# Patient Record
Sex: Male | Born: 1945 | Race: White | Hispanic: No | Marital: Married | Smoking: Former smoker
Health system: Southern US, Community
[De-identification: ages and names within clinical notes are randomized; demographics above are authoritative.]

## PROBLEM LIST (undated history)

## (undated) DIAGNOSIS — C4490 Unspecified malignant neoplasm of skin, unspecified: Secondary | ICD-10-CM

## (undated) DIAGNOSIS — K449 Diaphragmatic hernia without obstruction or gangrene: Secondary | ICD-10-CM

## (undated) DIAGNOSIS — E785 Hyperlipidemia, unspecified: Secondary | ICD-10-CM

## (undated) DIAGNOSIS — K219 Gastro-esophageal reflux disease without esophagitis: Secondary | ICD-10-CM

## (undated) DIAGNOSIS — K644 Residual hemorrhoidal skin tags: Secondary | ICD-10-CM

## (undated) DIAGNOSIS — K5730 Diverticulosis of large intestine without perforation or abscess without bleeding: Secondary | ICD-10-CM

## (undated) DIAGNOSIS — C3490 Malignant neoplasm of unspecified part of unspecified bronchus or lung: Secondary | ICD-10-CM

## (undated) DIAGNOSIS — Z86010 Personal history of colonic polyps: Secondary | ICD-10-CM

## (undated) DIAGNOSIS — K529 Noninfective gastroenteritis and colitis, unspecified: Secondary | ICD-10-CM

## (undated) HISTORY — DX: Hyperlipidemia, unspecified: E78.5

## (undated) HISTORY — DX: Unspecified malignant neoplasm of skin, unspecified: C44.90

## (undated) HISTORY — DX: Noninfective gastroenteritis and colitis, unspecified: K52.9

## (undated) HISTORY — DX: Hypercalcemia: E83.52

## (undated) HISTORY — DX: Diverticulosis of large intestine without perforation or abscess without bleeding: K57.30

## (undated) HISTORY — PX: INGUINAL HERNIA REPAIR: SHX194

## (undated) HISTORY — DX: Gastro-esophageal reflux disease without esophagitis: K21.9

## (undated) HISTORY — DX: Residual hemorrhoidal skin tags: K64.4

## (undated) HISTORY — DX: Diaphragmatic hernia without obstruction or gangrene: K44.9

## (undated) HISTORY — DX: Personal history of colonic polyps: Z86.010

## (undated) HISTORY — PX: VASECTOMY: SHX75

---

## 1999-02-26 ENCOUNTER — Encounter: Payer: Self-pay | Admitting: *Deleted

## 1999-02-26 ENCOUNTER — Ambulatory Visit (HOSPITAL_COMMUNITY): Admission: RE | Admit: 1999-02-26 | Discharge: 1999-02-26 | Payer: Self-pay | Admitting: *Deleted

## 2002-05-13 HISTORY — PX: RECTAL SURGERY: SHX760

## 2002-07-05 ENCOUNTER — Encounter: Payer: Self-pay | Admitting: Internal Medicine

## 2002-07-05 ENCOUNTER — Encounter: Admission: RE | Admit: 2002-07-05 | Discharge: 2002-07-05 | Payer: Self-pay | Admitting: Internal Medicine

## 2003-01-12 ENCOUNTER — Encounter (INDEPENDENT_AMBULATORY_CARE_PROVIDER_SITE_OTHER): Payer: Self-pay | Admitting: Specialist

## 2003-01-12 ENCOUNTER — Ambulatory Visit (HOSPITAL_COMMUNITY): Admission: RE | Admit: 2003-01-12 | Discharge: 2003-01-12 | Payer: Self-pay | Admitting: General Surgery

## 2003-07-01 ENCOUNTER — Encounter: Payer: Self-pay | Admitting: Internal Medicine

## 2004-06-26 ENCOUNTER — Ambulatory Visit: Payer: Self-pay | Admitting: Internal Medicine

## 2004-12-26 ENCOUNTER — Ambulatory Visit: Payer: Self-pay | Admitting: Internal Medicine

## 2005-03-12 ENCOUNTER — Ambulatory Visit: Payer: Self-pay | Admitting: Internal Medicine

## 2005-03-13 ENCOUNTER — Ambulatory Visit: Payer: Self-pay | Admitting: Internal Medicine

## 2005-04-02 ENCOUNTER — Ambulatory Visit: Payer: Self-pay | Admitting: Internal Medicine

## 2005-05-24 ENCOUNTER — Ambulatory Visit: Payer: Self-pay | Admitting: Internal Medicine

## 2005-12-03 ENCOUNTER — Ambulatory Visit: Payer: Self-pay | Admitting: Internal Medicine

## 2006-01-17 ENCOUNTER — Ambulatory Visit: Payer: Self-pay | Admitting: Internal Medicine

## 2006-01-21 ENCOUNTER — Encounter: Admission: RE | Admit: 2006-01-21 | Discharge: 2006-01-21 | Payer: Self-pay | Admitting: Internal Medicine

## 2006-01-22 ENCOUNTER — Ambulatory Visit: Payer: Self-pay | Admitting: Pulmonary Disease

## 2006-01-22 ENCOUNTER — Ambulatory Visit: Payer: Self-pay | Admitting: Cardiovascular Disease

## 2006-01-22 ENCOUNTER — Ambulatory Visit: Payer: Self-pay | Admitting: Internal Medicine

## 2006-01-27 ENCOUNTER — Encounter: Payer: Self-pay | Admitting: Pulmonary Disease

## 2006-02-19 ENCOUNTER — Encounter (INDEPENDENT_AMBULATORY_CARE_PROVIDER_SITE_OTHER): Payer: Self-pay | Admitting: *Deleted

## 2006-02-19 ENCOUNTER — Ambulatory Visit: Payer: Self-pay | Admitting: Pulmonary Disease

## 2006-02-19 ENCOUNTER — Ambulatory Visit: Admission: RE | Admit: 2006-02-19 | Discharge: 2006-02-19 | Payer: Self-pay | Admitting: Pulmonary Disease

## 2006-03-04 ENCOUNTER — Ambulatory Visit: Payer: Self-pay | Admitting: Pulmonary Disease

## 2006-04-25 ENCOUNTER — Ambulatory Visit: Payer: Self-pay | Admitting: Pulmonary Disease

## 2006-05-13 DIAGNOSIS — Z860100 Personal history of colon polyps, unspecified: Secondary | ICD-10-CM

## 2006-05-13 DIAGNOSIS — K644 Residual hemorrhoidal skin tags: Secondary | ICD-10-CM

## 2006-05-13 DIAGNOSIS — K449 Diaphragmatic hernia without obstruction or gangrene: Secondary | ICD-10-CM

## 2006-05-13 DIAGNOSIS — K5730 Diverticulosis of large intestine without perforation or abscess without bleeding: Secondary | ICD-10-CM

## 2006-05-13 HISTORY — DX: Diaphragmatic hernia without obstruction or gangrene: K44.9

## 2006-05-13 HISTORY — DX: Diverticulosis of large intestine without perforation or abscess without bleeding: K57.30

## 2006-05-13 HISTORY — DX: Personal history of colonic polyps: Z86.010

## 2006-05-13 HISTORY — DX: Personal history of colon polyps, unspecified: Z86.0100

## 2006-05-13 HISTORY — DX: Residual hemorrhoidal skin tags: K64.4

## 2006-05-16 ENCOUNTER — Ambulatory Visit: Payer: Self-pay | Admitting: Pulmonary Disease

## 2006-06-12 ENCOUNTER — Ambulatory Visit: Payer: Self-pay | Admitting: Pulmonary Disease

## 2006-07-11 ENCOUNTER — Ambulatory Visit: Payer: Self-pay | Admitting: Pulmonary Disease

## 2006-07-28 ENCOUNTER — Ambulatory Visit (HOSPITAL_COMMUNITY): Admission: RE | Admit: 2006-07-28 | Discharge: 2006-07-28 | Payer: Self-pay | Admitting: Surgery

## 2006-09-18 ENCOUNTER — Ambulatory Visit: Payer: Self-pay | Admitting: Pulmonary Disease

## 2006-10-28 ENCOUNTER — Telehealth (INDEPENDENT_AMBULATORY_CARE_PROVIDER_SITE_OTHER): Payer: Self-pay | Admitting: *Deleted

## 2006-10-30 DIAGNOSIS — J45909 Unspecified asthma, uncomplicated: Secondary | ICD-10-CM

## 2006-10-30 DIAGNOSIS — Z860100 Personal history of colon polyps, unspecified: Secondary | ICD-10-CM | POA: Insufficient documentation

## 2007-01-28 ENCOUNTER — Telehealth (INDEPENDENT_AMBULATORY_CARE_PROVIDER_SITE_OTHER): Payer: Self-pay | Admitting: *Deleted

## 2007-02-04 DIAGNOSIS — J82 Pulmonary eosinophilia, not elsewhere classified: Secondary | ICD-10-CM

## 2007-02-04 DIAGNOSIS — J8289 Other pulmonary eosinophilia, not elsewhere classified: Secondary | ICD-10-CM | POA: Insufficient documentation

## 2007-02-05 ENCOUNTER — Ambulatory Visit: Payer: Self-pay | Admitting: Pulmonary Disease

## 2007-02-05 ENCOUNTER — Ambulatory Visit: Payer: Self-pay | Admitting: Internal Medicine

## 2007-02-08 LAB — CONVERTED CEMR LAB
ALT: 25 U/L
AST: 37 U/L
Albumin: 3.9 g/dL
Alkaline Phosphatase: 67 U/L
BUN: 9 mg/dL
Basophils Absolute: 0.1 K/uL
Basophils Relative: 1.6 % — ABNORMAL HIGH
Bilirubin, Direct: 0.1 mg/dL
CO2: 27 meq/L
Calcium: 9.4 mg/dL
Chloride: 106 meq/L
Cholesterol: 203 mg/dL
Creatinine, Ser: 0.8 mg/dL
Direct LDL: 138.3 mg/dL
Eosinophils Absolute: 0.4 K/uL
Eosinophils Relative: 9.8 % — ABNORMAL HIGH
GFR calc Af Amer: 127 mL/min
GFR calc non Af Amer: 105 mL/min
Glucose, Bld: 85 mg/dL
HCT: 39.8 %
HDL: 59.7 mg/dL
Hemoglobin: 14.1 g/dL
Lymphocytes Relative: 45.3 %
MCHC: 35.4 g/dL
MCV: 95.3 fL
Monocytes Absolute: 0.3 K/uL
Monocytes Relative: 6.9 %
Neutro Abs: 1.5 K/uL
Neutrophils Relative %: 36.4 % — ABNORMAL LOW
PSA: 1.78 ng/mL
Platelets: 306 K/uL
Potassium: 4.4 meq/L
RBC: 4.18 M/uL — ABNORMAL LOW
RDW: 12.3 %
Sodium: 141 meq/L
TSH: 1.73 u[IU]/mL
Total Bilirubin: 1.2 mg/dL
Total CHOL/HDL Ratio: 3.4
Total Protein: 6.7 g/dL
Triglycerides: 48 mg/dL
VLDL: 10 mg/dL
WBC: 4.1 10*3/microliter — ABNORMAL LOW

## 2007-02-09 ENCOUNTER — Encounter (INDEPENDENT_AMBULATORY_CARE_PROVIDER_SITE_OTHER): Payer: Self-pay | Admitting: *Deleted

## 2007-02-17 ENCOUNTER — Ambulatory Visit: Payer: Self-pay | Admitting: Internal Medicine

## 2007-02-17 DIAGNOSIS — K219 Gastro-esophageal reflux disease without esophagitis: Secondary | ICD-10-CM | POA: Insufficient documentation

## 2007-02-17 DIAGNOSIS — E785 Hyperlipidemia, unspecified: Secondary | ICD-10-CM | POA: Insufficient documentation

## 2007-02-26 ENCOUNTER — Ambulatory Visit: Payer: Self-pay | Admitting: Gastroenterology

## 2007-03-25 ENCOUNTER — Encounter: Payer: Self-pay | Admitting: Internal Medicine

## 2007-03-25 ENCOUNTER — Encounter: Payer: Self-pay | Admitting: Gastroenterology

## 2007-03-25 ENCOUNTER — Ambulatory Visit: Payer: Self-pay | Admitting: Gastroenterology

## 2007-05-04 ENCOUNTER — Telehealth (INDEPENDENT_AMBULATORY_CARE_PROVIDER_SITE_OTHER): Payer: Self-pay | Admitting: *Deleted

## 2007-05-05 ENCOUNTER — Ambulatory Visit: Payer: Self-pay | Admitting: Pulmonary Disease

## 2007-07-15 DIAGNOSIS — K5730 Diverticulosis of large intestine without perforation or abscess without bleeding: Secondary | ICD-10-CM | POA: Insufficient documentation

## 2007-09-21 ENCOUNTER — Telehealth (INDEPENDENT_AMBULATORY_CARE_PROVIDER_SITE_OTHER): Payer: Self-pay | Admitting: *Deleted

## 2008-01-27 ENCOUNTER — Encounter (INDEPENDENT_AMBULATORY_CARE_PROVIDER_SITE_OTHER): Payer: Self-pay | Admitting: *Deleted

## 2008-02-10 ENCOUNTER — Telehealth (INDEPENDENT_AMBULATORY_CARE_PROVIDER_SITE_OTHER): Payer: Self-pay | Admitting: *Deleted

## 2008-02-24 ENCOUNTER — Encounter: Payer: Self-pay | Admitting: Internal Medicine

## 2008-03-08 ENCOUNTER — Telehealth (INDEPENDENT_AMBULATORY_CARE_PROVIDER_SITE_OTHER): Payer: Self-pay | Admitting: *Deleted

## 2008-03-10 ENCOUNTER — Telehealth (INDEPENDENT_AMBULATORY_CARE_PROVIDER_SITE_OTHER): Payer: Self-pay | Admitting: *Deleted

## 2008-03-15 ENCOUNTER — Ambulatory Visit: Payer: Self-pay | Admitting: Pulmonary Disease

## 2008-03-22 ENCOUNTER — Telehealth (INDEPENDENT_AMBULATORY_CARE_PROVIDER_SITE_OTHER): Payer: Self-pay | Admitting: *Deleted

## 2008-03-23 ENCOUNTER — Ambulatory Visit: Payer: Self-pay | Admitting: Pulmonary Disease

## 2008-06-21 ENCOUNTER — Ambulatory Visit: Payer: Self-pay | Admitting: Pulmonary Disease

## 2008-06-21 DIAGNOSIS — J309 Allergic rhinitis, unspecified: Secondary | ICD-10-CM | POA: Insufficient documentation

## 2008-06-27 ENCOUNTER — Telehealth (INDEPENDENT_AMBULATORY_CARE_PROVIDER_SITE_OTHER): Payer: Self-pay | Admitting: *Deleted

## 2008-07-01 ENCOUNTER — Telehealth: Payer: Self-pay | Admitting: Pulmonary Disease

## 2008-07-04 ENCOUNTER — Telehealth (INDEPENDENT_AMBULATORY_CARE_PROVIDER_SITE_OTHER): Payer: Self-pay | Admitting: *Deleted

## 2008-07-13 ENCOUNTER — Ambulatory Visit: Payer: Self-pay | Admitting: Internal Medicine

## 2008-07-13 ENCOUNTER — Encounter: Payer: Self-pay | Admitting: Pulmonary Disease

## 2008-07-15 ENCOUNTER — Telehealth: Payer: Self-pay | Admitting: Pulmonary Disease

## 2008-07-15 ENCOUNTER — Telehealth (INDEPENDENT_AMBULATORY_CARE_PROVIDER_SITE_OTHER): Payer: Self-pay | Admitting: *Deleted

## 2008-07-15 LAB — CONVERTED CEMR LAB
Basophils Absolute: 0.1 10*3/uL (ref 0.0–0.1)
Basophils Relative: 2.3 % (ref 0.0–3.0)
Eosinophils Absolute: 0.3 10*3/uL (ref 0.0–0.7)
HCT: 41.6 % (ref 39.0–52.0)
Hemoglobin: 14.6 g/dL (ref 13.0–17.0)
IgE (Immunoglobulin E), Serum: 96.5 intl units/mL (ref 0.0–180.0)
MCHC: 35.1 g/dL (ref 30.0–36.0)
MCV: 93.1 fL (ref 78.0–100.0)
Monocytes Absolute: 0.4 10*3/uL (ref 0.1–1.0)
Neutro Abs: 2.6 10*3/uL (ref 1.4–7.7)
RBC: 4.47 M/uL (ref 4.22–5.81)

## 2008-07-25 ENCOUNTER — Telehealth (INDEPENDENT_AMBULATORY_CARE_PROVIDER_SITE_OTHER): Payer: Self-pay | Admitting: *Deleted

## 2008-08-01 ENCOUNTER — Encounter (INDEPENDENT_AMBULATORY_CARE_PROVIDER_SITE_OTHER): Payer: Self-pay | Admitting: *Deleted

## 2008-08-01 ENCOUNTER — Telehealth: Payer: Self-pay | Admitting: Internal Medicine

## 2008-09-01 ENCOUNTER — Telehealth (INDEPENDENT_AMBULATORY_CARE_PROVIDER_SITE_OTHER): Payer: Self-pay | Admitting: *Deleted

## 2008-09-13 ENCOUNTER — Ambulatory Visit: Payer: Self-pay | Admitting: Internal Medicine

## 2008-09-13 DIAGNOSIS — R030 Elevated blood-pressure reading, without diagnosis of hypertension: Secondary | ICD-10-CM | POA: Insufficient documentation

## 2008-09-13 DIAGNOSIS — Z85828 Personal history of other malignant neoplasm of skin: Secondary | ICD-10-CM | POA: Insufficient documentation

## 2008-09-14 ENCOUNTER — Encounter: Payer: Self-pay | Admitting: Internal Medicine

## 2008-09-14 ENCOUNTER — Ambulatory Visit: Payer: Self-pay | Admitting: Pulmonary Disease

## 2008-09-15 LAB — CONVERTED CEMR LAB
Hemoglobin: 14.4 g/dL (ref 13.0–17.0)
Lymphocytes Relative: 27 % (ref 12–46)
Lymphs Abs: 1.8 10*3/uL (ref 0.7–4.0)
MCHC: 33.3 g/dL (ref 30.0–36.0)
Monocytes Absolute: 0.5 10*3/uL (ref 0.1–1.0)
Monocytes Relative: 7 % (ref 3–12)
Neutro Abs: 4.1 10*3/uL (ref 1.7–7.7)
RBC: 4.58 M/uL (ref 4.22–5.81)
WBC: 6.8 10*3/uL (ref 4.0–10.5)

## 2008-09-16 ENCOUNTER — Encounter (INDEPENDENT_AMBULATORY_CARE_PROVIDER_SITE_OTHER): Payer: Self-pay | Admitting: *Deleted

## 2008-09-27 ENCOUNTER — Encounter (INDEPENDENT_AMBULATORY_CARE_PROVIDER_SITE_OTHER): Payer: Self-pay | Admitting: *Deleted

## 2009-02-08 ENCOUNTER — Ambulatory Visit: Payer: Self-pay | Admitting: Pulmonary Disease

## 2009-02-13 ENCOUNTER — Encounter: Payer: Self-pay | Admitting: Internal Medicine

## 2009-03-15 ENCOUNTER — Telehealth: Payer: Self-pay | Admitting: Gastroenterology

## 2009-03-20 ENCOUNTER — Ambulatory Visit: Payer: Self-pay | Admitting: Pulmonary Disease

## 2009-04-26 ENCOUNTER — Telehealth (INDEPENDENT_AMBULATORY_CARE_PROVIDER_SITE_OTHER): Payer: Self-pay | Admitting: *Deleted

## 2009-05-09 ENCOUNTER — Telehealth: Payer: Self-pay | Admitting: Pulmonary Disease

## 2009-06-08 ENCOUNTER — Ambulatory Visit (HOSPITAL_COMMUNITY): Admission: RE | Admit: 2009-06-08 | Discharge: 2009-06-08 | Payer: Self-pay | Admitting: Surgery

## 2009-06-16 ENCOUNTER — Telehealth: Payer: Self-pay | Admitting: Pulmonary Disease

## 2009-09-04 ENCOUNTER — Telehealth (INDEPENDENT_AMBULATORY_CARE_PROVIDER_SITE_OTHER): Payer: Self-pay | Admitting: *Deleted

## 2009-09-06 ENCOUNTER — Telehealth (INDEPENDENT_AMBULATORY_CARE_PROVIDER_SITE_OTHER): Payer: Self-pay | Admitting: *Deleted

## 2009-09-13 ENCOUNTER — Telehealth: Payer: Self-pay | Admitting: Pulmonary Disease

## 2009-10-03 ENCOUNTER — Telehealth: Payer: Self-pay | Admitting: Pulmonary Disease

## 2009-10-03 ENCOUNTER — Encounter: Payer: Self-pay | Admitting: Pulmonary Disease

## 2009-10-10 ENCOUNTER — Ambulatory Visit: Payer: Self-pay | Admitting: Pulmonary Disease

## 2009-11-07 ENCOUNTER — Telehealth (INDEPENDENT_AMBULATORY_CARE_PROVIDER_SITE_OTHER): Payer: Self-pay | Admitting: *Deleted

## 2009-12-14 ENCOUNTER — Telehealth (INDEPENDENT_AMBULATORY_CARE_PROVIDER_SITE_OTHER): Payer: Self-pay | Admitting: *Deleted

## 2010-01-16 ENCOUNTER — Encounter (INDEPENDENT_AMBULATORY_CARE_PROVIDER_SITE_OTHER): Payer: Self-pay | Admitting: *Deleted

## 2010-01-16 ENCOUNTER — Telehealth (INDEPENDENT_AMBULATORY_CARE_PROVIDER_SITE_OTHER): Payer: Self-pay | Admitting: *Deleted

## 2010-01-22 ENCOUNTER — Encounter: Payer: Self-pay | Admitting: Internal Medicine

## 2010-02-09 ENCOUNTER — Telehealth (INDEPENDENT_AMBULATORY_CARE_PROVIDER_SITE_OTHER): Payer: Self-pay | Admitting: *Deleted

## 2010-02-19 ENCOUNTER — Telehealth (INDEPENDENT_AMBULATORY_CARE_PROVIDER_SITE_OTHER): Payer: Self-pay | Admitting: *Deleted

## 2010-02-21 ENCOUNTER — Ambulatory Visit: Payer: Self-pay | Admitting: Internal Medicine

## 2010-02-27 ENCOUNTER — Telehealth (INDEPENDENT_AMBULATORY_CARE_PROVIDER_SITE_OTHER): Payer: Self-pay | Admitting: *Deleted

## 2010-02-28 LAB — CONVERTED CEMR LAB
Albumin: 4.2 g/dL (ref 3.5–5.2)
Basophils Relative: 1.1 % (ref 0.0–3.0)
CO2: 29 meq/L (ref 19–32)
Calcium: 9.9 mg/dL (ref 8.4–10.5)
Chloride: 103 meq/L (ref 96–112)
Cholesterol: 241 mg/dL — ABNORMAL HIGH (ref 0–200)
Direct LDL: 158.8 mg/dL
Eosinophils Relative: 5.4 % — ABNORMAL HIGH (ref 0.0–5.0)
HCT: 42.2 % (ref 39.0–52.0)
MCV: 97.4 fL (ref 78.0–100.0)
Monocytes Absolute: 0.5 10*3/uL (ref 0.1–1.0)
Monocytes Relative: 9.2 % (ref 3.0–12.0)
Neutrophils Relative %: 35.6 % — ABNORMAL LOW (ref 43.0–77.0)
PSA: 1.86 ng/mL (ref 0.10–4.00)
RBC: 4.33 M/uL (ref 4.22–5.81)
Sodium: 140 meq/L (ref 135–145)
TSH: 4.43 microintl units/mL (ref 0.35–5.50)
Total CHOL/HDL Ratio: 4
Total Protein: 6.9 g/dL (ref 6.0–8.3)
Triglycerides: 92 mg/dL (ref 0.0–149.0)
VLDL: 18.4 mg/dL (ref 0.0–40.0)
WBC: 5.6 10*3/uL (ref 4.5–10.5)

## 2010-03-05 ENCOUNTER — Telehealth (INDEPENDENT_AMBULATORY_CARE_PROVIDER_SITE_OTHER): Payer: Self-pay | Admitting: *Deleted

## 2010-03-07 ENCOUNTER — Ambulatory Visit: Payer: Self-pay | Admitting: Internal Medicine

## 2010-03-07 LAB — CONVERTED CEMR LAB
HDL goal, serum: 40 mg/dL
LDL Goal: 160 mg/dL

## 2010-03-16 ENCOUNTER — Telehealth: Payer: Self-pay | Admitting: Internal Medicine

## 2010-03-16 ENCOUNTER — Telehealth: Payer: Self-pay | Admitting: Gastroenterology

## 2010-03-19 ENCOUNTER — Encounter: Payer: Self-pay | Admitting: Internal Medicine

## 2010-03-20 ENCOUNTER — Encounter: Payer: Self-pay | Admitting: Internal Medicine

## 2010-04-27 ENCOUNTER — Telehealth (INDEPENDENT_AMBULATORY_CARE_PROVIDER_SITE_OTHER): Payer: Self-pay | Admitting: *Deleted

## 2010-05-13 DIAGNOSIS — K529 Noninfective gastroenteritis and colitis, unspecified: Secondary | ICD-10-CM

## 2010-05-13 HISTORY — DX: Noninfective gastroenteritis and colitis, unspecified: K52.9

## 2010-06-10 LAB — CONVERTED CEMR LAB
Alkaline Phosphatase: 56 units/L (ref 39–117)
Bilirubin, Direct: 0.2 mg/dL (ref 0.0–0.3)
GFR calc Af Amer: 126 mL/min
Glucose, Bld: 104 mg/dL — ABNORMAL HIGH (ref 70–99)
Potassium: 4.4 meq/L (ref 3.5–5.1)
Sodium: 140 meq/L (ref 135–145)
Specific Gravity, Urine: 1.01 (ref 1.000–1.035)
TSH: 4.68 microintl units/mL (ref 0.35–5.50)
Total Protein, Urine: NEGATIVE mg/dL
Total Protein: 6.7 g/dL (ref 6.0–8.3)
Urine Glucose: NEGATIVE mg/dL
Urobilinogen, UA: 0.2 (ref 0.0–1.0)
VLDL: 8 mg/dL (ref 0–40)

## 2010-06-12 NOTE — Assessment & Plan Note (Signed)
Summary: med refil/cbs   Vital Signs:  Patient profile:   65 year old male Height:      72 inches (182.88 cm) Weight:      185.13 pounds (84.15 kg) BMI:     25.20 O2 Sat:      99 % on Room air Temp:     98.9 degrees F (37.17 degrees C) oral Pulse rate:   57 / minute Resp:     16 per minute BP sitting:   160 / 120  (left arm) Cuff size:   regular  Vitals Entered By: Lucious Groves CMA (March 07, 2010 12:59 PM)  O2 Flow:  Room air  CC: med refill/cpx./kb, General Medical Evaluation, COPD follow-up, Lipid Management Is Patient Diabetic? No Pain Assessment Patient in pain? no      Comments Patient notes that vitamin d3 is on hold due to studies he heard about./kb   Primary Care Levante Simones:  [name]  CC:  med refill/cpx./kb, General Medical Evaluation, COPD follow-up, and Lipid Management.  History of Present Illness:        [name] is here for a physical; he is essentially asymptomatic. Symbicort has been effective  as a controller.  The patient denies shortness of breath, chest tightness, wheezing, cough, increased sputum, heat intolerance, nocturnal awakening, and exercise induced symptoms.  Symptoms occur very rarely.  The patient reports no limitation in activities.  Medication use includes quick relief med rarely ( last used 08/2009)and  the controller med daily.  Symptoms  have been  worse with pollen in Spring & Fall historically.  This past Spring he was able to control symptoms with rescue inhaler.Last steroids in 2010.  Lipid Management History:      Positive NCEP/ATP III risk factors include male age 27 years old or older and family history for ischemic heart disease (males less than 64 years old).  Negative NCEP/ATP III risk factors include non-diabetic, non-tobacco-user status, non-hypertensive, no ASHD (atherosclerotic heart disease), no prior stroke/TIA, no peripheral vascular disease, and no history of aortic aneurysm.     Current Medications (verified): 1)   Aciphex 20 Mg  Tbec (Rabeprazole Sodium) .Marland Kitchen.. 1 By Mouth Once Daily **appointment Due** 2)  Singulair 10 Mg  Tabs (Montelukast Sodium) .... Take 1 Tablet By Mouth Once A Day 3)  Flonase 50 Mcg/act  Susp (Fluticasone Propionate) .... Two Puffs Each Nostril Daily 4)  Symbicort 160-4.5 Mcg/act  Aero (Budesonide-Formoterol Fumarate) .... Take Two Puffs Two Times A Day 5)  Multivitamins   Tabs (Multiple Vitamin) .... Take One By Mouth Once Daily 6)  Fish Oil   Oil (Fish Oil) 7)  Calcium 500 500 Mg  Tabs (Calcium Carbonate) 8)  Zyrtec Allergy 10 Mg  Tabs (Cetirizine Hcl) .Marland Kitchen.. 1 By Mouth Qd 9)  Maxair Autohaler 200 Mcg/inh  Aerb (Pirbuterol Acetate) .... Use As Directed 10)  Analpram-Hc 1-2.5 % Crea (Hydrocortisone Ace-Pramoxine) .... Use As Directed As Needed 11)  Mucinex 600 Mg Xr12h-Tab (Guaifenesin) .Marland Kitchen.. 1-2 Every 12 Hours As Needed 12)  Vitamin C 500 Mg Tabs (Ascorbic Acid) .... Once Daily 13)  Vit D3 .... On Hold 14)  Aspir-Low 81 Mg Tbec (Aspirin) .Marland Kitchen.. 1 By Mouth Qd  Allergies (verified): No Known Drug Allergies  Past History:  Past Medical History: Asthma, extrinsic Colonic polyps, PMH  of, Dr Jarold Motto Hypercalcemia, PMH of  1986 (11.0) Hyperlipidemia, LDL goal = < 110 based on NMR Lipoprofile: LDL 138( 1624/877),HDL 47, TG 64. Framingham Study LDL goal = < 130.  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 primary  Social History: Former Smoker: quit 1992 Alcohol use-yes: socially no diet Occupation: Pensions consultant Married Regular  exercise-yes: 5X/week or >  as running  & aerobics , yoga  Review of Systems  The patient denies anorexia, fever, weight loss, weight gain, vision loss, decreased hearing, hoarseness, chest pain, syncope, peripheral edema, headaches, abdominal pain, melena, hematochezia, severe indigestion/heartburn, hematuria, suspicious skin lesions, depression, unusual weight change, abnormal bleeding, enlarged lymph nodes, and angioedema.    Physical Exam  General:  well-nourished;alert,appropriate and cooperative throughout examination Head:  Normocephalic and atraumatic without obvious abnormalities. No  alopecia Eyes:  No corneal or conjunctival inflammation noted. Perrla. Funduscopic exam benign, without hemorrhages, exudates or papilledema.  Ears:  External ear exam shows no significant lesions or deformities.  Otoscopic examination reveals clear canals, tympanic membranes are intact bilaterally without bulging, retraction, inflammation or discharge. Hearing is grossly normal bilaterally. Nose:  External nasal examination shows no deformity or inflammation. Nasal mucosa are  dry  without lesions or exudates. Mouth:  Oral mucosa and oropharynx without lesions or exudates.  Teeth in good repair. Slightly hoarse Neck:  No deformities, masses, or tenderness noted. Lungs:  Normal respiratory effort, chest expands symmetrically. Lungs : mild low grade rhonchi @ bases w/o increased work of breathing Heart:  Normal rate and regular rhythm. S1 and S2 normal without gallop, murmur, click, rub .S4 Abdomen:  Bowel sounds positive,abdomen soft and non-tender without masses, organomegaly . Small umbilical  hernia  noted. Rectal:  No external abnormalities noted. Normal sphincter tone. No rectal masses or tenderness. Genitalia:  Testes bilaterally descended without nodularity, tenderness or masses. No scrotal masses or lesions. No penis lesions or urethral discharge. Small  L varicocele.   Prostate:  Prostate gland  firm and smooth, no enlargement, nodularity, tenderness, mass, asymmetry or induration. Msk:  No deformity or scoliosis noted of thoracic or lumbar spine.   Pulses:  R and L carotid,radial,dorsalis pedis and posterior tibial pulses are full and equal bilaterally Extremities:  No clubbing, cyanosis, edema, or deformity noted with normal full range of motion of all joints.   Neurologic:  alert & oriented X3 and DTRs symmetrical and normal.   Skin:  Intact without suspicious lesions or rashes Cervical Nodes:  No lymphadenopathy noted Axillary Nodes:  No palpable lymphadenopathy Inguinal Nodes:  No significant adenopathy Psych:  memory intact for recent and remote, normally interactive, and good eye contact.     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 1 by mouth once daily **appointment due** 2)  Singulair 10 Mg Tabs (Montelukast sodium) .... Take 1 tablet by mouth once a day 3)  Flonase 50 Mcg/act Susp (Fluticasone propionate) .... Two puffs each nostril daily 4)  Symbicort  160-4.5 Mcg/act Aero (Budesonide-formoterol fumarate) .... Take two puffs two times a day 5)  Multivitamins Tabs (Multiple vitamin) .... Take one by mouth once daily 6)  Fish Oil Oil (Fish oil) 7)  Calcium 500 500 Mg Tabs (Calcium carbonate) 8)  Zyrtec Allergy 10 Mg Tabs (Cetirizine hcl) .Marland Kitchen.. 1 by mouth qd 9)  Maxair Autohaler 200 Mcg/inh Aerb (Pirbuterol acetate) .... Use as directed 10)  Analpram-hc 1-2.5 % Crea (Hydrocortisone ace-pramoxine) .... Use as directed as needed 11)  Mucinex 600 Mg Xr12h-tab (Guaifenesin) .Marland Kitchen.. 1-2 every 12 hours as needed 12)  Vitamin C 500 Mg Tabs (Ascorbic acid)  .... Once daily 13)  Vit D3  .... On hold 14)  Aspir-low 81 Mg Tbec (Aspirin) .Marland Kitchen.. 1 by mouth qd  Lipid Assessment/Plan:      Based on NCEP/ATP III, the patient's risk factor category is "2 or more risk factors and a calculated 10 year CAD risk of > 20%".  The patient's lipid goals are as follows: Total cholesterol goal is 200; LDL cholesterol goal is 160; HDL cholesterol goal is 40; Triglyceride goal is 150.    Patient Instructions: 1)  Review Dr Gildardo Griffes Eat , Drink & Be Healthy. 2)  It is important that you exercise regularly at least 20 minutes 5 times a week. If you develop chest pain, have severe difficulty breathing, or feel very tired , stop exercising immediately and seek medical attention. 3)  Take an  81 mg coated Aspirin every day. 4)  Please schedule a follow-up appointment in 6 months. 5)  Hepatic Panel prior to visit, ICD-9:790.4 6)  [facility] Lipid Panel prior to visit, ICD-9:272.4, V17.3.   Orders Added: 1)  Est. Patient 40-64 years [99396]   Immunization History:  Influenza Immunization History:    Influenza:  historical (01/11/2010)   Immunization History:  Influenza Immunization History:    Influenza:  Historical (01/11/2010)   Appended Document: med refil/cbs "BP is only elevated @ MD visits". Monitor  BP ; goal = < 135/85 ON AVERAGE . Take cuff to every MD  appt. Check BP cuff  against drug store or Y cuff.

## 2010-06-12 NOTE — Progress Notes (Signed)
Summary: aciphex refill---needs prior auth  Phone Note Refill Request Message from:  Fax from Pharmacy on March 16, 2010 11:50 AM  Refills Requested: Medication #1:  ACIPHEX 20 MG  TBEC 1 by mouth once daily **APPOINTMENT DUE** [facility], fax [phone]    "ins now requires prior auth....as of 03/13/10"-----pharmacy requests notification when approval obtained-----[phone],     customer id = [id number]  Initial call taken by: Jerolyn Shin,  March 16, 2010 11:57 AM  Follow-up for Phone Call        Per request samples placed up front for patient.  Lucious Groves CMA,  March 16, 2010 2:44 PM  Forms requested. Lucious Groves CMA  March 16, 2010 3:56 PM   Forms completed and faxed. Will await insurance company reply. Lucious Groves CMA  March 19, 2010 3:50 PM        Appended Document: aciphex refill---needs prior auth Approved until 2014. Left message on voicemail notifying the patient and faxed approval to [facility] pharmacy.

## 2010-06-12 NOTE — Assessment & Plan Note (Signed)
Summary: rov for asthma, CR   Primary Provider/Referring Provider:  [name]  CC:  Pt is here for a 6 month f/u appt.  Pt denies sob, cough, and acid reflux.  Pt states he hasn't needed to use rescue hfa often.  Pt also denies sore throat and head congestion.  Marland Kitchen  History of Present Illness: the pt comes in today for f/u of his known asthma and h/o pulmonary eosinophilia.  He is doing quite well on his current regimen, and has been exercising regularly.  He has not had nocturnal symptoms, and almost never uses his rescue inhaler.  He has no significant cough or sinus issues.  Medications Prior to Update: 1)  Aciphex 20 Mg  Tbec (Rabeprazole Sodium) .Marland Kitchen.. 1 By Mouth Qd 2)  Singulair 10 Mg  Tabs (Montelukast Sodium) .... Once Daily 3)  Flonase   Susp (Fluticasone Propionate Susp) .... Use 1 Spray in Each Nostril Once Daily 4)  Symbicort 160-4.5 Mcg/act  Aero (Budesonide-Formoterol Fumarate) .... Take Two Puffs Two Times A Day 5)  Multivitamins   Tabs (Multiple Vitamin) .... Take One By Mouth Once Daily 6)  Fish Oil   Oil (Fish Oil) 7)  Calcium 500 500 Mg  Tabs (Calcium Carbonate) 8)  Zyrtec Allergy 10 Mg  Tabs (Cetirizine Hcl) .Marland Kitchen.. 1 By Mouth Qd 9)  Maxair Autohaler 200 Mcg/inh  Aerb (Pirbuterol Acetate) .... Use As Directed 10)  Analpram-Hc 1-2.5 % Crea (Hydrocortisone Ace-Pramoxine) .... Use As Directed As Needed 11)  Mucinex 600 Mg Xr12h-Tab (Guaifenesin) .Marland Kitchen.. 1-2 Every 12 Hours As Needed 12)  Vitamin C 500 Mg Tabs (Ascorbic Acid) .... Once Daily  Allergies (verified): No Known Drug Allergies  Review of Systems  The patient denies shortness of breath with activity, shortness of breath at rest, productive cough, non-productive cough, coughing up blood, chest pain, irregular heartbeats, acid heartburn, indigestion, loss of appetite, weight change, abdominal pain, difficulty swallowing, sore throat, tooth/dental problems, headaches, nasal congestion/difficulty breathing through nose, sneezing,  itching, ear ache, anxiety, depression, hand/feet swelling, joint stiffness or pain, rash, change in color of mucus, and fever.    Vital Signs:  Patient profile:   65 year old male Height:      72 inches Weight:      183 pounds BMI:     24.91 O2 Sat:      98 % on Room air Temp:     98.4 degrees F oral Pulse rate:   60 / minute BP sitting:   120 / 72  (right arm) Cuff size:   regular  Vitals Entered By: Arman Filter LPN (Oct 10, 2009 8:54 AM)  O2 Flow:  Room air CC: Pt is here for a 6 month f/u appt.  Pt denies sob, cough, acid reflux.  Pt states he hasn't needed to use rescue hfa often.  Pt also denies sore throat and head congestion.   Comments Medications reviewed with patient Arman Filter LPN  Oct 10, 2009 8:55 AM    Physical Exam  General:  wd male in nad Nose:  no obvious sinus drainage or purulence. Lungs:  totally clear to auscultation Heart:  rrr, no mrg Extremities:  no edema or cyanosis Neurologic:  alert and oriented, moves all 4.   Impression & Recommendations:  Problem # 1:  ASTHMA (ICD-493.90) the pt is doing very well from a pulmonary standpoint.  He is exercising regularly, has no symptoms of an asthma flare, and is not requiring his rescue inhaler.  I have asked  him to continue on his current regimen, and to followup with me in one year.  Problem # 2:  ALLERGIC RHINITIS, CHRONIC (ICD-477.9)  he has been doing very well on his current meds. No recent flares or bouts of sinusitis.  Other Orders: Pneumococcal Vaccine (24401) Admin 1st Vaccine (02725) Est. Patient Level III (36644)  Patient Instructions: 1)  no change in meds. 2)  followup with me in one year, or sooner if having issues.     Immunizations Administered:  Pneumonia Vaccine:    Vaccine Type: Pneumovax    Site: left deltoid    Mfr: Merck    Dose: 0.5 ml    Route: IM    Given by: Arman Filter LPN    Exp. Date: 12/23/2010    Lot #: [id number]    VIS given: 12/09/95 version given  Oct 10, 2009.

## 2010-06-12 NOTE — Medication Information (Signed)
Summary: Symbicort/[facility]  Symbicort/[facility]   Imported By: Sherian Rein 10/16/2009 11:36:05  _____________________________________________________________________  External Attachment:    Type:   Image     Comment:   External Document

## 2010-06-12 NOTE — Progress Notes (Signed)
Summary: NEED LAB ORDERS--entered 10/5  Phone Note Call from Patient Call back at Work Phone [phone]   Caller: Patient Summary of Call: PATIENT NEEDS A PHYSICAL--LAST ONE WAS 09/13/2008---SAYS HE WANTS TO GO TO [place] TO HAVE LABS NEXT WEEK  PLEASE LET ME KNOW ORDERS THAT DR HOPPER WANTS FOR HIM, THEN I WILL CALL HIM FOR THE LAB APPT AND THEN FOR HIS PHYSICAL Initial call taken by: Jerolyn Shin,  February 09, 2010 5:02 PM  Follow-up for Phone Call        V70.0: lipids, hepatic panel, BMET, TSH, CBC & dif, PSA Follow-up by: Marga Melnick MD,  February 12, 2010 1:18 PM  Additional Follow-up for Phone Call Additional follow up Details #1::        LEFT MESSAGE FOR PATIENT TO CALL  ME BACK TO SET UP LABS AT [place]..Jerolyn Shin  February 13, 2010 4:12 PM  called back; set up lab appt--he will set up physical when he gets copy of his labs Additional Follow-up by: Jerolyn Shin,  February 14, 2010 3:47 PM

## 2010-06-12 NOTE — Letter (Signed)
Summary: Primary Care Appointment Letter  [facility] [place]  [address] [place], [place] [address]   Phone: [phone]  Fax: [phone]    01/16/2010 MRN: [id number]  [facility] [address] [place], [place]  [address]  Dear Mr. [facility],   Your Primary Care Physician Marga Melnick MD has indicated that:    ___X____it is time to schedule an appointment(Last OV 09/2008-17months ago, it is time for a yearly chek-up).    _______you missed your appointment on______ and need to call and          reschedule.    _______you need to have lab work done.    _______you need to schedule an appointment discuss lab or test results.    _______you need to call to reschedule your appointment that is                       scheduled on _________.     Please call our office as soon as possible. Our phone number is [phone]. Please press option 1. Our office is open 8a-5p, Monday through Friday.     Thank you,    [facility]

## 2010-06-12 NOTE — Progress Notes (Signed)
Summary: REFILL REQUEST  Phone Note Refill Request Call back at [phone] Message from:  Pharmacy on December 14, 2009 12:02 PM  Refills Requested: Medication #1:  SINGULAIR 10 MG  TABS once daily **APPOINTMENT DUE FOR ADDITIONAL REFILLS**   Dosage confirmed as above?Dosage Confirmed   Supply Requested: 1 month   Last Refilled: 11/07/2009 [facility]  Initial call taken by: Lavell Islam,  December 14, 2009 12:02 PM    Prescriptions: SINGULAIR 10 MG  TABS (MONTELUKAST SODIUM) once daily **APPOINTMENT DUE FOR ADDITIONAL REFILLS**  #30 x 0   Entered by:   Shonna Chock CMA   Authorized by:   Marga Melnick MD   Signed by:   Shonna Chock CMA on 12/14/2009   Method used:   Electronically to        [facility] (retail)       [address]       [place], [place]  [address]       Ph: [phone]       Fax: [phone]   RxID:   [id number]

## 2010-06-12 NOTE — Medication Information (Signed)
Summary: Prior Authorization for Aciphex/[employer]  Prior Authorization for Aciphex/[employer]   Imported By: Lanelle Bal 04/11/2010 14:17:41  _____________________________________________________________________  External Attachment:    Type:   Image     Comment:   External Document

## 2010-06-12 NOTE — Progress Notes (Signed)
Summary: refill  Phone Note Refill Request Message from:  Fax from Pharmacy on February 19, 2010 3:40 PM  Refills Requested: Medication #1:  ACIPHEX 20 MG  TBEC 1 by mouth once daily **APPOINTMENT DUE** [facility] - fax [phone]  Initial call taken by: Okey Regal [place],  February 19, 2010 3:41 PM    Prescriptions: ACIPHEX 20 MG  TBEC (RABEPRAZOLE SODIUM) 1 by mouth once daily **APPOINTMENT DUE**  #15 x 0   Entered by:   Shonna Chock CMA   Authorized by:   Marga Melnick MD   Signed by:   Shonna Chock CMA on 02/19/2010   Method used:   Electronically to        [facility] (retail)       [address]       [place], [place]  [address]       Ph: [phone]       Fax: [phone]   RxID:   [id number]

## 2010-06-12 NOTE — Progress Notes (Signed)
Summary: singular  Phone Note Call from Patient   Caller: sandra/assistant Call For: [name] [name] Summary of Call: pt need new rx for singular 3mos supply with 3 refills send in to [facility] Initial call taken by: Oneita Jolly,  Sep 13, 2009 9:19 AM  Follow-up for Phone Call        The patient would like to have Dr. Shelle Iron do the refills for his Singulair. Please advise if this is okay.Michel Bickers CMA  Sep 13, 2009 10:41 AM  Additional Follow-up for Phone Call Additional follow up Details #1::        ok with me. Additional Follow-up by: Barbaraann Share MD,  Sep 13, 2009 10:43 AM    Additional Follow-up for Phone Call Additional follow up Details #2::    rx sent. pt advised.Carron Curie CMA  Sep 13, 2009 10:54 AM   Prescriptions: SINGULAIR 10 MG  TABS (MONTELUKAST SODIUM) once daily  #90 x 3   Entered by:   Carron Curie CMA   Authorized by:   Barbaraann Share MD   Signed by:   Carron Curie CMA on 09/13/2009   Method used:   Faxed to ...       [facility]* (mail-order)             ,          Ph: [phone]       Fax: [phone]   RxID:   [id number]

## 2010-06-12 NOTE — Progress Notes (Signed)
Summary: REFILL REQUEST  Phone Note Refill Request Call back at [phone] Message from:  Pharmacy on November 07, 2009 9:55 AM  Refills Requested: Medication #1:  ACIPHEX 20 MG  TBEC 1 by mouth qd   Dosage confirmed as above?Dosage Confirmed   Supply Requested: 1 month   Last Refilled: 09/04/2009 [facility]  Next Appointment Scheduled: Oct 10 2010 Initial call taken by: Lavell Islam,  November 07, 2009 10:01 AM    New/Updated Medications: ACIPHEX 20 MG  TBEC (RABEPRAZOLE SODIUM) 1 by mouth once daily **APPOINTMENT DUE** Prescriptions: ACIPHEX 20 MG  TBEC (RABEPRAZOLE SODIUM) 1 by mouth once daily **APPOINTMENT DUE**  #30 x 0   Entered by:   Shonna Chock   Authorized by:   Marga Melnick MD   Signed by:   Shonna Chock on 11/07/2009   Method used:   Electronically to        [facility] (retail)       [address]       [place], [place]  [address]       Ph: [phone]       Fax: [phone]   RxID:   [id number]

## 2010-06-12 NOTE — Miscellaneous (Signed)
Summary: [facility]  [facility]   Imported By: Lanelle Bal 01/31/2010 11:11:07  _____________________________________________________________________  External Attachment:    Type:   Image     Comment:   External Document

## 2010-06-12 NOTE — Progress Notes (Signed)
Summary: Refill request  Phone Note Refill Request Message from:  Pharmacy  Refills Requested: Medication #1:  SINGULAIR 10 MG  TABS once daily [facility]   Method Requested: Fax to Mail Away Pharmacy    Prescriptions: SINGULAIR 10 MG  TABS (MONTELUKAST SODIUM) once daily  #90 x 0   Entered by:   Shonna Chock   Authorized by:   Marga Melnick MD   Signed by:   Shonna Chock on 09/06/2009   Method used:   Faxed to ...       [facility]* (mail-order)             ,          Ph: [phone]       Fax: [phone]   RxID:   [id number]

## 2010-06-12 NOTE — Progress Notes (Signed)
Summary: Refill Request  Phone Note Refill Request Call back at [phone] Message from:  Pharmacy on September 04, 2009 11:08 AM  Refills Requested: Medication #1:  ACIPHEX 20 MG  TBEC 1 by mouth qd   Dosage confirmed as above?Dosage Confirmed   Supply Requested: 1 month   Last Refilled: 07/31/2009 [facility] Do not send esribe!!!!  Next Appointment Scheduled: none Initial call taken by: Harold Barban,  September 04, 2009 11:08 AM    RPrescriptions: ACIPHEX 20 MG  TBEC (RABEPRAZOLE SODIUM) 1 by mouth qd  #30 Tablet x 1   Entered by:   Shonna Chock   Authorized by:   Marga Melnick MD   Signed by:   Shonna Chock on 09/04/2009   Method used:   Printed then faxed to ...       [facility] (retail)       [address]       [place], [place]  [address]       Ph: [phone]       Fax: [phone]   RxID:   [id number]

## 2010-06-12 NOTE — Progress Notes (Signed)
Summary: prescript fo Singulair----90 day supply sent to [facility]  Phone Note Call from Patient Call back at [phone] ex [phone]   Caller: Significant Other sandra Call For: clance Summary of Call: need singulair 10mg  for 90 day supply sent to [facility]. Initial call taken by: Rickard Patience,  February 19, 2010 9:13 AM  Follow-up for Phone Call        rx sent to pharmacy.  LMOM informing [name] of this.  Megan Reynolds LPN  February 19, 2010 9:20 AM     New/Updated Medications: SINGULAIR 10 MG  TABS (MONTELUKAST SODIUM) Take 1 tablet by mouth once a day Prescriptions: SINGULAIR 10 MG  TABS (MONTELUKAST SODIUM) Take 1 tablet by mouth once a day  #90 x 3   Entered by:   Arman Filter LPN   Authorized by:   Barbaraann Share MD   Signed by:   Arman Filter LPN on 45/40/9811   Method used:   Electronically to        [facility]* (retail)             ,          Ph: [phone]       Fax: [phone]   RxID:   [id number]

## 2010-06-12 NOTE — Progress Notes (Signed)
Summary: symbicort  Phone Note Call from Patient   Caller: sandra-sec for [name] [name] Call For: [name] Summary of Call: i have sent this message 2times and i don't see either one of them(weird) pt wants [name] [name] to fill his symbicort instead of [name] hopper and it need to be send to [facility] pharmacy #3 with 3refill asap pt is out of symbicort may need a sample/call sandrea [phone] Initial call taken by: Oneita Jolly,  Oct 03, 2009 10:02 AM  Follow-up for Phone Call        we sent in a RX for symbicort on 09/06/09, so I called [facility] and they stated the rx was cancelled because there is a hold on the account. Per [facility] rep pt needs to contact them and deal with the hold before an rx will be accepted. I called and spoke to [place], pt sec. and advised of this. SH estates she will call [facility] and see what the problem is and call us back if a new rx is needed. Carron Curie CMA  Oct 03, 2009 10:26 AM

## 2010-06-12 NOTE — Progress Notes (Signed)
Summary: refill on flonase  Phone Note Call from Patient   Caller: [name] [name] Call For: clance Summary of Call: needs gen flonase called in she thought megan had done this but pharmacy said not called in Initial call taken by: Oneita Jolly,  February 27, 2010 9:51 AM  Follow-up for Phone Call        called and spoke with pt's secretary [name].  [name] is requesting refill on pt's generic flonse sent to [facility] pharmacy. Kc, please advise if ok to send rx. Pt last saw you may 31,2011 and has yearly f/u appt scheduled for Oct 10, 2010.   thank you.  Aundra Millet Reynolds LPN  February 27, 2010 10:17 AM   Additional Follow-up for Phone Call Additional follow up Details #1::        why wouldn't I refill? I have prescribed for him before, and see him yearly. Additional Follow-up by: Barbaraann Share MD,  February 27, 2010 12:40 PM    Additional Follow-up for Phone Call Additional follow up Details #2::    rx sent to [facility].  [name] aware.  Aundra Millet Reynolds LPN  February 27, 2010 1:00 PM   New/Updated Medications: FLONASE 50 MCG/ACT  SUSP (FLUTICASONE PROPIONATE) Two puffs each nostril daily Prescriptions: FLONASE 50 MCG/ACT  SUSP (FLUTICASONE PROPIONATE) Two puffs each nostril daily  #1 x 5   Entered by:   Arman Filter LPN   Authorized by:   Barbaraann Share MD   Signed by:   Arman Filter LPN on 36/64/4034   Method used:   Electronically to        [facility] (retail)       [address]       [place], [place]  [address]       Ph: [phone]       Fax: [phone]   RxID:   [id number]

## 2010-06-12 NOTE — Progress Notes (Signed)
Summary: ACIPHEX REFILL  Phone Note Refill Request Message from:  Fax from Pharmacy on January 16, 2010 2:10 PM  Refills Requested: Medication #1:  ACIPHEX 20 MG  TBEC 1 by mouth once daily **APPOINTMENT DUE[address], [place]   [place] [phone]     Next Appointment Scheduled: NONE Initial call taken by: Jerolyn Shin,  January 16, 2010 2:10 PM    Prescriptions: ACIPHEX 20 MG  TBEC (RABEPRAZOLE SODIUM) 1 by mouth once daily **APPOINTMENT DUE**  #30 x 0   Entered by:   Shonna Chock CMA   Authorized by:   Marga Melnick MD   Signed by:   Shonna Chock CMA on 01/16/2010   Method used:   Electronically to        [facility] (retail)       [address]       [place], [place]  [address]       Ph: [phone]       Fax: [phone]   RxID:   [id number]  Appointment Due letter Harriett Rush CMA  January 16, 2010 3:59 PM

## 2010-06-12 NOTE — Progress Notes (Signed)
Summary: speak to nurse-LMTCB  Phone Note Call from Patient Call back at Work Phone [phone]   Caller: Patient Call For: Iolani Twilley Reason for Call: Talk to Nurse Summary of Call: want to speak to nurse(Megan) re: getting a client in.  I offered to help, he said he needed to speak to [facility] Initial call taken by: Eugene Gavia,  June 16, 2009 9:48 AM  Follow-up for Phone Call        [facility]. Carron Curie CMA  June 16, 2009 10:23 AM  dr Jayant Kriz mr holleman would like for you to call him at 4pm today he will be by one of these numbers-[phone] or [phone], this is re: the client he had questions about before but he states the two of you never got to talk due to busy schedules on both ends  Philipp Deputy [facility]  June 16, 2009 11:50 AM    Additional Follow-up for Phone Call Additional follow up Details #1::        tried the 478 number above and lmom tried the 339 number and told it was a wrong # tried home phone and LMOM. Additional Follow-up by: Barbaraann Share MD,  June 16, 2009 6:02 PM

## 2010-06-12 NOTE — Progress Notes (Signed)
Summary: fyi  Phone Note Call from Patient   Caller: Patient Summary of Call: patient is upset because he only received 15 pills / aciphex & paid the same amt as he would for 30 - he felt dr hopper should have know that - patient thought office was going to call him to schedule cpx/med refill after lab was back - he has an appt [phone] & wants to be sure meds are refilled .Marland KitchenOkey Regal Spring  March 05, 2010 9:58 AM     Follow-up for Phone Call        Noted, patient informed on refills appointment due since June and letter mailed in 01/2010 informing him appointment due.  Follow-up by: Shonna Chock CMA,  March 05, 2010 10:15 AM    Prescriptions: ACIPHEX 20 MG  TBEC (RABEPRAZOLE SODIUM) 1 by mouth once daily **APPOINTMENT DUE**  #30 x 1   Entered by:   Shonna Chock CMA   Authorized by:   Marga Melnick MD   Signed by:   Shonna Chock CMA on 03/05/2010   Method used:   Electronically to        [facility] (retail)       [address]       [place], [place]  [id number]       Ph: [phone]       Fax: [phone]   RxID:   [id number]

## 2010-06-12 NOTE — Progress Notes (Signed)
Summary: Refill Request  Phone Note Refill Request Call back at [phone] Message from:  Pharmacy on December 14, 2009 2:26 PM  Refills Requested: Medication #1:  ACIPHEX 20 MG  TBEC 1 by mouth once daily **APPOINTMENT DUE**   Dosage confirmed as above?Dosage Confirmed   Supply Requested: 1 month   Last Refilled: 11/07/2009 [facility]  Next Appointment Scheduled: none Initial call taken by: Lavell Islam,  December 14, 2009 2:26 PM    Prescriptions: ACIPHEX 20 MG  TBEC (RABEPRAZOLE SODIUM) 1 by mouth once daily **APPOINTMENT DUE**  #30 x 0   Entered by:   Shonna Chock CMA   Authorized by:   Marga Melnick MD   Signed by:   Shonna Chock CMA on 12/14/2009   Method used:   Electronically to        [facility] (retail)       [address]       [place], [place]  [address]       Ph: [phone]       Fax: [phone]   RxID:   [id number]

## 2010-06-12 NOTE — Medication Information (Signed)
Summary: Approval for Aciphex/[employer]  Approval for Aciphex/[employer]   Imported By: Lanelle Bal 03/29/2010 13:54:44  _____________________________________________________________________  External Attachment:    Type:   Image     Comment:   External Document

## 2010-06-12 NOTE — Progress Notes (Signed)
Summary: Samples   Phone Note Call from Patient   Caller: Beverely Pace  [phone] Call For: Dr. Jarold Motto Reason for Call: Talk to Nurse Summary of Call: Calling for samples of Aciphex.Marland KitchenMarland Kitchen"[facility] has some but does not want to drive there." Initial call taken by: Karna Christmas,  March 16, 2010 2:52 PM  Follow-up for Phone Call        advised the patients Secretary [name] that we have not seen the patient since 2008 and she was advised this last year when she called for samples. explained he can go pick up the samples at Dr. Caryl Never office, but to get samples here he will need an office visit.  Follow-up by: Harlow Mares CMA Duncan Dull),  March 16, 2010 3:15 PM

## 2010-06-14 NOTE — Progress Notes (Signed)
Summary: aciphex refill  Phone Note Refill Request Call back at Work Phone [phone] Message from:  Patient on April 27, 2010 10:51 AM  Refills Requested: Medication #1:  ACIPHEX 20 MG  TBEC 1 by mouth once daily **APPOINTMENT DUE**   Notes: saw Dr [name] 03/07/2010---does he need to see Dr [name] again?? call prescription in to [facility]  Next Appointment Scheduled: none Initial call taken by: Jerolyn Shin,  April 27, 2010 11:00 AM    New/Updated Medications: ACIPHEX 20 MG  TBEC (RABEPRAZOLE SODIUM) 1 by mouth once daily Prescriptions: ACIPHEX 20 MG  TBEC (RABEPRAZOLE SODIUM) 1 by mouth once daily  #90 x 1   Entered by:   Shonna Chock CMA   Authorized by:   Marga Melnick MD   Signed by:   Shonna Chock CMA on 04/27/2010   Method used:   Electronically to        [facility] (retail)       [address]       [place], [place]  [address]       Ph: [phone]       Fax: [phone]   RxID:   [id number]

## 2010-07-29 LAB — DIFFERENTIAL
Basophils Absolute: 0 10*3/uL (ref 0.0–0.1)
Basophils Relative: 1 % (ref 0–1)
Eosinophils Relative: 6 % — ABNORMAL HIGH (ref 0–5)
Monocytes Absolute: 0.4 10*3/uL (ref 0.1–1.0)
Monocytes Relative: 8 % (ref 3–12)

## 2010-07-29 LAB — COMPREHENSIVE METABOLIC PANEL
ALT: 33 U/L (ref 0–53)
AST: 53 U/L — ABNORMAL HIGH (ref 0–37)
Albumin: 4 g/dL (ref 3.5–5.2)
Alkaline Phosphatase: 64 U/L (ref 39–117)
BUN: 14 mg/dL (ref 6–23)
Chloride: 102 mEq/L (ref 96–112)
GFR calc Af Amer: >60 mL/min (ref >60)
Potassium: 4.9 mEq/L (ref 3.5–5.1)
Sodium: 139 mEq/L (ref 135–145)
Total Bilirubin: 0.7 mg/dL (ref 0.3–1.2)
Total Protein: 6.7 g/dL (ref 6.0–8.3)

## 2010-07-29 LAB — CBC
HCT: 39.9 % (ref 39.0–52.0)
Platelets: 264 10*3/uL (ref 150–400)
RDW: 12.9 % (ref 11.5–15.5)
WBC: 5.1 10*3/uL (ref 4.0–10.5)

## 2010-09-25 NOTE — Assessment & Plan Note (Signed)
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 He  also in the past has been referred to Dr.  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   The patient continues to have some rectal bleeding and discomfort and  slight bowel irregularity. His last colonoscopy examination was  approximately five years ago and he does have a history of adenomatous  colon polyps. He does not have reflux symptoms at this time, but was  recently treated extensively for eosinophilic pneumonia.  Per our  endoscopy in September of 2004, did show a hiatal hernia and some distal  esophagitis, but there was no evidence of Barrett's mucosa and CLO  biopsy for H-pylori was normal. Esophageal biopsies were benign, but I  doubt that they were stained for eosinophils. In any case, the patient  currently denies dysphagia problems.   His appetite is good and his weight is stable. He denies any specific  food intolerances. He denies any hepatobiliary complaints.   PAST MEDICAL HISTORY:  1. Asthma.  2. Recent treatment for eosinophilic pneumonia, which required steroid      interventions. He denies current pulmonary complaints and says his      pulmonary function has returned to normal. He otherwise is in good      health and denies chronic medical problems.   FAMILY HISTORY:  Noncontributory.   MEDICATIONS:  1. Singular 10 mg a day.  2. Aciphex 20 mg every other day.  3. Symbicort two twice a day.  4. Flonase daily.  5. Multivitamin daily.  6. Fish oil.  7.  Vitamin C.  8. Calcium.  9. Flax seed oil.  10.Vitamin D daily.   ALLERGIES:  Denies drug allergies.   FAMILY HISTORY:  Noncontributory except for atherosclerotic  cardiovascular disease in his father. There are no known  gastrointestinal problems, colon polyps or colon cancer.   SOCIAL HISTORY:  He is married and lives with his wife. 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 He does not smoke and uses ethanol  socially.   REVIEW OF SYSTEMS:  Noncontributory.   PHYSICAL EXAMINATION:  He is a healthy-appearing white male in no  distress, appearing his stated age. He is 6 feet, 1 inches tall and  weighs 185 pounds. Blood pressure 140/76, pulse 60 and regular.  I could not appreciate stigmata of chronic liver disease.  CHEST: Was clear without wheezes or rhonchi.  He appeared to be in a regular rhythm without murmurs, gallops or rubs.  I could not appreciate hepatosplenomegaly, abdominal masses or  tenderness.  Inspection of the rectum did show some scarring posteriorly at the anal  valve, but no definite  fistula or drainage. There was a small amount of  external hemorrhoidal tissue which was non-bleeding. Rectal examination  showed no masses or tenderness or fluctuation. There was soft stool  present that was guaiac negative.  Peripheral extremities were normal.  Mental status was clear.   ASSESSMENT:  1. Chronic gastroesophageal reflux disease. Consider chronic      eosinophilic esophagitis.  2. Diverticulosis coli without episodes of known diverticulitis.  3. Recurrent rectal bleeding probably mixed hemorrhoids.  4. Status post repair of a rectal fistula by Dr.  Kendrick Ranch.  5. 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   RECOMMENDATIONS:  1. Reflux regimen reviewed and have urged Mr. Joesph Fillers to take his      Aciphex regularly 30 minutes before the first meal of the day.  2. High fiber diet daily Citrucel and liberal p.o. fluids.   3. Diverticulosis movie shown to the patient.  4. P.r.n. sitz baths and Analpram cream as needed.  5. Followup endoscopy and colonoscopy examinations, possible endoscopy      for biopsies of esophagus for eosinophil examination.  6. Continue other medications and followup with Dr.  [name] as      previously planned.     Vania Rea. Jarold Motto, MD, Caleen Essex, FAGA  Electronically Signed    DRP/MedQ  DD: 02/26/2007  DT: 02/26/2007  Job #: [id number]   cc:   [name]. [name], MD,FACP,FCCP  Barbaraann Share, MD,FCCP

## 2010-09-28 NOTE — Op Note (Signed)
[name] [name], [name] [name] NO.:  [id number]   MEDICAL RECORD NO.:  [id number]          PATIENT TYPE:  AMB   LOCATION:  DAY                          FACILITY:  [facility]   PHYSICIAN:  Sandria Bales. Ezzard Standing, M.D.  DATE OF BIRTH:  1945-09-05   DATE OF PROCEDURE:  07/28/2006  DATE OF DISCHARGE:                               OPERATIVE REPORT   PREOPERATIVE DIAGNOSIS:  Right inguinal hernia.   POSTOPERATIVE DIAGNOSIS:  Medium-sized indirect right inguinal hernia.   PROCEDURE:  Laparoscopic right inguinal hernia repair with Bard 3-D  mesh.   SURGEON:  Dr. Ezzard Standing. No first assistant.   ANESTHESIA:  General anesthesia.   COMPLICATIONS:  None.   INDICATIONS FOR PROCEDURE:  Mr. Joesph Fillers is a 65 year old white male  patient of Dr. Marga Melnick who has a symptomatic right inguinal  hernia. He has also had some trouble with asthma, and I waited until he  was off of steroids for approximately a month before trying to repair  this hernia.   The potential complications of hernia repair were explained to the  patient. Potential complaints include but are not limited to infection,  bleeding, nerve injury and recurrence of the hernia.   OPERATIVE NOTE:  The patient had his right side marked by me during the  preoperative status. We held time out after patient underwent general  anesthesia. He was given 1 g of Ancef at the initiation of the  procedure. His abdomen was shaved, prepped with Betadine solution and  sterilely draped.   I went through an infra-umbilical incision. I went through the anterior  rectus fascia on the right, retracted the rectus abdominal muscle  anteriorly and placed the PBD [place] surgical balloon in the preperitoneal  space, insufflated this under direct visualization.   This showed good dissection of the preperitoneal tissues with the rectus  muscle anteriorly and the peritoneum posteriorly.   I then placed 2 additional trocars, a 5 mm in the left lower  quadrant  and a 5 mm in the right lower quadrant. I then started my dissection,  identifying the pubic tubercle, Cooper's ligament. The patient had no  evidence of a direct inguinal hernia, but he did have a medium-sized  indirect inguinal hernia.   I teased the sac out of the inguinal canal. The sac's length was  approximately 7 to 8 cm. I placed an Endoloop suture along the base of  this sac. I encircled the cord structures and saw no other hernias,  though I did retract, pulled also a lipoma of the cord.   I used a Bard 3-D mesh of large size for the right side repair. This was  introduced into the peritoneal space. It was stapled immediately to the  pubic tubercle, inferiorly to Cooper's ligament, superiorly to the  transversalis fascia and laterally under direct palpation I avoided the  area lateral to the cord structures and inferior to the iliopubic track.   The mesh laid flat, covered potential hernia space as well, and there  was no evidence of any bleeding.   The trocars were then removed in turn. The umbilical  port was closed.  The fascia was closed with a 0 Vicryl suture anteriorly. The skin at  each site was closed with a 5-0 Monocryl suture, painted with tincture  of Benzoin and Steri-Stripped.   Foley was removed at the end of the case. The patient did have a  moderate pneumoscrotum which should resolve.   The patient tolerated the procedure well, transported to the recovery  room in good condition. Plan discharge home today      Sandria Bales. Ezzard Standing, M.D.  Electronically Signed     DHN/MEDQ  D:  07/28/2006  T:  07/28/2006  Job:  [id number]   cc:   [name]. [name], MD,FACP,FCCP  [phone] [place]  [place] [address]   Barbaraann Share, MD,FCCP  [address]  [place]  [place] [address]

## 2010-09-28 NOTE — Letter (Signed)
January 24, 2006     [name] Share, MD,FCCP  [address]  [place], [place] [address]   RE:  [name], [name]  MRN:  [id number]  /  DOB:  February 20, 1946   Dear [name] [name],   Thank you for seeing [name] [name] in such a timely fashion.   When I saw him on January 22, 2006 to evaluate his progressive exertional  dyspnea in the setting of abnormal chest x-ray suggesting atypical  pneumonitides, I had planned observation admission in respiratory isolation  context.  He requested an outpatient evaluation and in view of his stable  vital signs, oxygen saturation and pulmonary exam, I felt this was  reasonable.   He states he has had dyspnea on exertion for approximately 6 weeks.  He  feels this is temporally related to the documentation of 15% eosinophil on a  CBC and differential on December 03, 2005.  He describes a restriction  phenomenon rather than asthmatic flare.  His asthma has actually been stable  on Asmanex and Foradil, but he has experienced a definite decrease in  exercise capacity.  Additionally, he has having night sweats, chills and  fatigue.  He states he is in bed by 9 p.m.  He denies any fever or purulent  secretions.   Significantly, he sustained fractured ribs and a pneumothorax in 1998  following a fall.  He has not smoked since 1992.  He has no significant  travel or animal exposures.  He has no occupational exposures either.   Family history is negative for pulmonary disease.   His review of systems was otherwise negative.   He was afebrile.  Blood pressure was mildly elevated at 140/90.  Positive  findings included arteriolar narrowing on fundal exam.  He had minimal  rales, but no active wheezing.  I could appreciate no lymphadenopathy or  organomegaly.   Although his hematocrit had dropped to 36 on January 17, 2006 from a value  of 44.4 on December 03, 2005, Hemoccult testing was negative.  Thyroid functions  were normal; these were done when he called reporting  fatigue.   At the time I saw him, I had not had the opportunity to review his x-ray, as  it was en route by courier.  Dr. Sherrie Mustache described bilateral upper lobe  infiltrates and a right lower lobe infiltrate as well.  After reviewing the  film, an atypical process is certainly suggested rather than bacterial  pneumonia.  Obviously, with hypereosinophilia, eosinophilic pneumonia must  be considered, but I am more concerned about Mycobacterium avium-  intracellulare, in view of his underlying lung disease, or some process such  as sarcoid, in view of his anemia which has presented since July.   My major concern is his health, but I would be concerned about him flying to  [place] if we cannot rule out a communicable process.  Additionally, once  arriving in [place], whether pleasure or business trip, I have misgivings  about asthma flares with the pollution issues in [place] and other large  cities.   I appreciate your evaluation and recommendations.    Sincerely,      [name] [name]. [name] Ren, MD,FACP,FCCP   WFH/MedQ  DD:  01/24/2006  DT:  01/24/2006  Job #:  [id number]

## 2010-09-28 NOTE — Op Note (Signed)
NAMESTEPHANIE, [name] [name] NO.:  [id number]   MEDICAL RECORD NO.:  [id number]          PATIENT TYPE:  AMB   LOCATION:  CARD                         FACILITY:  [facility]   PHYSICIAN:  Barbaraann Share, MD,FCCPDATE OF BIRTH:  08-02-1945   DATE OF PROCEDURE:  02/19/2006  DATE OF DISCHARGE:                                 OPERATIVE REPORT   PROCEDURE:  Flexible fiberoptic bronchoscopy, bronchoalveolar lavage and  transbronchial lung biopsy.   INDICATION:  Pulmonary infiltrates of unknown etiology.   OPERATOR:  Barbaraann Share, MD   ANESTHESIA:  Demerol 100 mg IV, as well as Versed 12.5 milligrams IV in  various aliquots.   DESCRIPTION:  After obtaining informed consent under close cardiopulmonary  monitoring the above preop anesthesia was given.  The fiberoptic scope was  passed through the right naris and into the posterior pharynx where there  were no lesions or other abnormalities seen. Vocal cords appeared within  normal limits and moved bilaterally on phonation.  Scope was passed into the  trachea where we examined along the entire length down to the level of the  carina all of which was normal.  The right tracheobronchial tree was  examined to the subsegmental level with no endobronchial process being  found.  The left tracheobronchial tree was then examined with normal mucosa  and no abnormality except for mucus plug noted in the left lower lobe  bronchus.  This was suctioned aggressively and the underlying mucosa and  anatomy distally was all normal.  Bronchoalveolar lavage was then done from  the apical segment of the left upper lobe as well as the superior segment of  the right lower lobe.  Transbronchial biopsies were done in the apical and  posterior segment of the right upper lobe as well as the superior segment of  the right lower lobe.  This was done under fluoroscopy.  Overall the patient  tolerated the procedure well.  No complications.  Chest x-ray is  pending at  time of dictation to rule out pneumothorax post biopsy.           ______________________________  Barbaraann Share, MD,FCCP     KMC/MEDQ  D:  02/19/2006  T:  02/21/2006  Job:  [id number]   cc:   [name]. [name], MD,FACP,FCCP  [phone] [place]  [place] [address]

## 2010-09-28 NOTE — Op Note (Signed)
NAME:  [name] [name], [name] [name]                        ACCOUNT NO.:  [id number]   MEDICAL RECORD NO.:  [id number]                   PATIENT TYPE:  AMB   LOCATION:  DAY                                  FACILITY:  [facility]   PHYSICIAN:  Timothy E. Earlene Plater, M.D.              DATE OF BIRTH:  1945/11/14   DATE OF PROCEDURE:  01/13/2003  DATE OF DISCHARGE:                                 OPERATIVE REPORT   PREOPERATIVE DIAGNOSIS:  Fistula in ano.   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   OPERATIVE PROCEDURES:  1. Examination under anesthesia.  2. Fistulectomy.   SURGEON:  Timothy E. Earlene Plater, M.D.   ANESTHESIA:  General.   Mr. Joesph Fillers is an otherwise healthy 89 and is past due for colonoscopy.  Has had evidence for perirectal fistula for some years with fairly  persistent drainage, occasional soreness.  He has been seen twice in the  office and surgery was recommended, but at a time of his choice.  He had a  colonoscopy and upper endoscopy today by Vania Rea. Jarold Motto, M.D., and  wishes to proceed with this exploration under anesthesia.  He has been  carefully explained and agrees and understands the procedure.   The patient was taken to the operating room and placed supine, general  endotracheal anesthesia was administered.  He was carefully placed in  lithotomy, the perianal area inspected, prepped and draped in the usual  fashion.  A chronic granulomatous lesion was exactly in the left lateral  buttock area approximately 3 cm outside the anal verge.  This area was  gently probed and, in fact, the probe proceeded anteriorly and ended.  I did  try to inject diluted methylene blue into the fistula tract, and it would  not flush.  On anoscopy in the left lateral there was a deepened crypt in  the direct left lateral position, and probe of this area was also normal.  So I injected the fistula area around and about with 0.25% Marcaine with  epinephrine mixed with Wydase.  I then excised the external fistulous  opening as an ellipse in a radial fashion.  In doing so I cut across a deep  fibrous chronically-inflamed area, and the fistula tract was now slightly  blue-stained and did, in fact, proceed superiorly.  I opened the skin and  subcu as I followed the tract superiorly, and that ended blindly in the deep  subcu space.  I excised the blue-stained fistulous tract and back at the  original external fistulous opening site there was a dense, thick fibrous  mass.  I felt it was external to the sphincters.  I did very carefully shell  this out and remove what I hoped would be only a granulomatous fistula  tract.  Fibers of the external oblique were definitely involved.  I did  place two figure-of-eight sutures of 2-0 Vicryl in the  outer deep aspects of  the external sphincter.  I loosely closed the skin with 2-0 Monocryl.  There  was no bleeding or complication.  The internal mucosa of the rectum not  penetrated purposely, since it did not show evidence of an internal fistula.  Gelfoam gauze and a dry sterile dressing were applied.  Counts correct.  He  tolerated it well and was removed to the recovery room in good condition.   Written and verbal instructions were given to the patient and his wife,  including Percocet 5 mg, #36, and he will be followed as an outpatient.                                                Timothy E. Earlene Plater, M.D.    TED/MEDQ  D:  01/12/2003  T:  01/13/2003  Job:  [id number]   cc:   [name]. [name], M.D. [facility]   Vania Rea. Jarold Motto, M.D. [facility]

## 2010-09-28 NOTE — Assessment & Plan Note (Signed)
[facility]                               PULMONARY OFFICE NOTE   [name], [name]                     MRN:          [id number]  DATE:01/22/2006                            DOB:          1946/04/22    HISTORY OF PRESENT ILLNESS:  The patient is a 65 year old white male, who I  have been asked to see for bilateral pulmonary infiltrates of unknown  etiology.  The patient has been a lifelong asthmatic, and has been fairly  well controlled on his regimen of Foradil, Asmanex as well as Singulair.  The patient states that he is normally extremely active, and works out on a  regular basis.  He has noticed over the last 4 to 6 weeks there has been a  decrease in his lung capacity.  He feels that this has significantly  compromised his workout regimen.  The patient has had a chest x-ray which  has shown significant bilateral infiltrates, primarily in the upper lobes  and peripheral as well as some in the right lower lobe.  Chest x-rays in  2003 and 2004 just showed hyperinflation with no evidence of infiltration.  The patient has also been noted to have an eosinophil count as high as 15%  on a CBC from July of 2007.  The patient states that he has had no cough or  mucus production of significance.  He has had no hemoptysis.  He denies any  fevers, chills or sweats, and has only had occasional night sweats in the  past few months.  The patient has had no out-of-country travel the last 6  months, and has no unusual exposures or hobbies.  He has no unusual pets.  The patient has never been exposed to tuberculosis, and states that he has  had a negative PPD in the past.  He denies any rashes or increased joint  complaints.   PAST MEDICAL HISTORY:  Significant for asthma, otherwise is noncontributory.   CURRENT MEDICATIONS:  1. Singular 10 mg daily.  2. Asmanex 1 spray b.i.d.  3. Foradil 1 spray b.i.d.  4. Aciphex q.a.m.   ALLERGIES:  No known drug  allergies.   SOCIAL HISTORY:  The patient is married and has children.  He works as an  Pensions consultant.  He has a history of smoking 3 to 4 cigarettes a day for 10 years.  He has not smoked since 1992.   FAMILY HISTORY:  Remarkable for his father having heart disease, otherwise  is noncontributory.   REVIEW OF SYSTEMS:  As per history of present illness, and also see the  patient intake form documented in the chart.   PHYSICAL EXAMINATION:  GENERAL:  he is a thin white mane in no acute  distress.  Blood pressure is 152/96, pulse 66, temperature is 98.6, weight is 180  pounds.  O2 saturation on room air is 98%.  HEENT:  Pupils equal, round and reactive to light and accommodation.  Extraocular muscles are intact.  Nares are patent without discharge.  Oropharynx is clear.  NECK:  Supple without JVD or lymphadenopathy.  There is  no palpable  thyromegaly.  CHEST:  Clear anteriorly.  However, there are significant crackles  posteriorly in the upper lung zones.  CARDIAC:  Exam reveals regular rate and rhythm, no murmurs, rubs or gallops.  ABDOMEN:  Soft and nontender, with good bowel sounds.  Genital exam, rectal exam and breast exam were not done and not indicated.  EXTREMITIES:  Lower extremities are without edema.  Good pulses distally, no  calf tenderness.  NEUROLOGIC:  Alert and oriented with no obvious observable motor defects.   IMPRESSION:  Bilateral pulmonary infiltrates of unknown etiology in a  patient with a history of asthma.  The patient has only seen some compromise  of his pulmonary function of late, and has no constitutional symptoms, or  any symptoms suggestive of infection.  Looking at the x-ray, I suspect there  is both an acute and at least subacute chronic process here.   The differential diagnoses certainly include with his eosinophilia, allergic  bronchopulmonary aspergillosis, possible sarcoidosis and eosinophilic  pneumonia.   I had a long conversation with him  about this, and how we should proceed.  I  do think he needs a CT scan of the chest followed by fiberoptic bronchoscopy  with biopsy.  The patient is agreeable to this.   PLAN:  1. Check a high-resolution CT of the chest.  2. Place a PPD in the office, and to have it read in 48 hours.  3. Check serum precipitins to Aspergillus.  4. Check IgE level.  5. The patient will need fiberoptic bronchoscopy when he returns from      [place].                                   Barbaraann Share, MD,FCCP   [facility]/MedQ  DD:  01/24/2006  DT:  01/26/2006  Job #:  [id number]   cc:   [name]. [name], MD,FACP,FCCP

## 2010-10-02 ENCOUNTER — Encounter: Payer: Self-pay | Admitting: Pulmonary Disease

## 2010-10-09 ENCOUNTER — Encounter: Payer: Self-pay | Admitting: Gastroenterology

## 2010-10-09 ENCOUNTER — Other Ambulatory Visit (INDEPENDENT_AMBULATORY_CARE_PROVIDER_SITE_OTHER): Payer: BC Managed Care – PPO

## 2010-10-09 ENCOUNTER — Ambulatory Visit (INDEPENDENT_AMBULATORY_CARE_PROVIDER_SITE_OTHER): Payer: BC Managed Care – PPO | Admitting: Gastroenterology

## 2010-10-09 DIAGNOSIS — R197 Diarrhea, unspecified: Secondary | ICD-10-CM

## 2010-10-09 DIAGNOSIS — E785 Hyperlipidemia, unspecified: Secondary | ICD-10-CM

## 2010-10-09 MED ORDER — DIPHENOXYLATE-ATROPINE 2.5-0.025 MG PO TABS: 1.0000 | ORAL_TABLET | Freq: Four times a day (QID) | ORAL | Status: AC | PRN

## 2010-10-09 NOTE — Patient Instructions (Signed)
Please go to the basement today for your labs.  Call back on Monday to let Dr Jarold Motto know how you are doing. Your prescription(s) have been sent to you pharmacy.

## 2010-10-09 NOTE — Progress Notes (Signed)
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 He denies new medications, antibiotic use, abdominal pain, upper GI or hepatobiliary complaints. He does suffer from chronic GERD and is on AcipHex 20 mg a day. Review of his labs shows a mild chronic low-grade alcoholic hepatitis picture, and the patient continues use ethanol daily. He denies a history of hepatitis, pancreatitis, nausea vomiting, and also denies systemic complaints such as fever, chills, skin rashes, joint pains, oral stomatitis. His appetite is good and his weight is stable and he has no specific food intolerances. He does use a large amount of by mouth sorbitol and fructose in the form of diet chewing gum. There is no history of rectal bleeding, melena, and said her cigarette use. He does use when necessary ProctoCream locally for hemorrhoidal care. The patient is up-to-date on his endoscopy and colonoscopy exams. Family history is noncontributory. There is no history of known celiac disease, his family is [place] in origin.  Current Medications, Allergies, Past Medical History, Past Surgical History, Family History and Social History were reviewed in [employer] record.  Pertinent Review of Systems Negative   Physical Exam: Cannot appreciate stigmata of chronic liver disease. His chest is clear there are no significant murmurs gallops or rubs, and he appears to be in a regular rhythm. There is no hepatosplenomegaly, abdominal masses or tenderness. Bowel sounds are nonobstructive. Peripheral tremors unremarkable. Mental status is normal.   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 I have asked him to discontinue these substances from his diet, will prescribe a course of probiotics, when necessary Lomotil, and we'll obtain labs  and stool exams for review. Patient is going to [place] in several days' time. At his request also drew a lipid panel which apparently was requested by Dr. [name]. Encounter Diagnoses  Name Primary?  . Hyperlipemia   . Diarrhea

## 2010-10-10 ENCOUNTER — Ambulatory Visit: Payer: Self-pay | Admitting: Pulmonary Disease

## 2010-10-10 ENCOUNTER — Other Ambulatory Visit: Payer: BC Managed Care – PPO

## 2010-10-10 DIAGNOSIS — R197 Diarrhea, unspecified: Secondary | ICD-10-CM

## 2010-10-10 DIAGNOSIS — E785 Hyperlipidemia, unspecified: Secondary | ICD-10-CM

## 2010-10-10 LAB — CELIAC PANEL 10
Gliadin IgA: 13.3 U/mL (ref <20)
Tissue Transglut Ab: 6.6 U/mL (ref <20)
Tissue Transglutaminase Ab, IgA: 9.5 U/mL (ref <20)

## 2010-10-11 ENCOUNTER — Other Ambulatory Visit: Payer: Self-pay | Admitting: Gastroenterology

## 2010-10-11 ENCOUNTER — Telehealth: Payer: Self-pay | Admitting: Pulmonary Disease

## 2010-10-11 ENCOUNTER — Ambulatory Visit (AMBULATORY_SURGERY_CENTER): Payer: BC Managed Care – PPO | Admitting: *Deleted

## 2010-10-11 ENCOUNTER — Telehealth: Payer: Self-pay | Admitting: *Deleted

## 2010-10-11 VITALS — Ht 72.0 in | Wt 184.7 lb

## 2010-10-11 DIAGNOSIS — R197 Diarrhea, unspecified: Secondary | ICD-10-CM

## 2010-10-11 LAB — NMR LIPOPROFILE WITH LIPIDS
Cholesterol, Total: 216 mg/dL — ABNORMAL HIGH (ref <200)
HDL Particle Number: 39.9 umol/L (ref >=30.5)
HDL-C: 74 mg/dL (ref >=40)
LP-IR Score: 23 (ref <=45)
Small LDL Particle Number: 102 nmol/L (ref <=527)

## 2010-10-11 LAB — CAROTENE, SERUM

## 2010-10-11 LAB — OVA AND PARASITE EXAMINATION: OP: NONE SEEN

## 2010-10-11 LAB — FECAL FAT QUALITATIVE
Free Fatty Acids: NORMAL
NEUTRAL FAT: NORMAL

## 2010-10-11 MED ORDER — PEG-KCL-NACL-NASULF-NA ASC-C 100 G PO SOLR: ORAL | Status: DC

## 2010-10-11 NOTE — Telephone Encounter (Signed)
Message copied by Florene Glen on Thu Oct 11, 2010 12:09 PM ------      Message from: Jarold Motto, DAVID R      Created: Thu Oct 11, 2010 11:57 AM       He has positive white cells in his stool consistent with colitis. See if he can come back tomorrow afternoon colonoscopy exam before he goes out of the country next week.

## 2010-10-11 NOTE — Telephone Encounter (Signed)
RETURNED CALL FROM TRIAGE. CALL HER BACK AT SAME # [name] [name]

## 2010-10-11 NOTE — Telephone Encounter (Signed)
Message copied by Florene Glen on Thu Oct 11, 2010 12:14 PM ------      Message from: Jarold Motto, DAVID R      Created: Thu Oct 11, 2010 11:57 AM       He has positive white cells in his stool consistent with colitis. See if he can come back tomorrow afternoon colonoscopy exam before he goes out of the country next week.

## 2010-10-11 NOTE — Telephone Encounter (Signed)
Spoke with [name] and advised we are faxing form back to her to be filled out by the pt. Looks like KC has not signed this, so I asked that once it is completed, fax back and we will KC sign tomorrow.

## 2010-10-11 NOTE — Telephone Encounter (Signed)
Form was received and is in triage but the RX has not been signed yet, because the pt did not complete his portion of the form, I [id number] with sandra pt secretary to advise and to fax form back for pt to complete. Carron Curie, CMA

## 2010-10-11 NOTE — Telephone Encounter (Signed)
lmom for pt to call back

## 2010-10-11 NOTE — Telephone Encounter (Signed)
Spoke with pt who will come in today for repeat Carotene- not enough blood- and then he will come for PreVisit teaching for COLON tomorrow at 3:30pm.

## 2010-10-12 ENCOUNTER — Encounter: Payer: Self-pay | Admitting: Gastroenterology

## 2010-10-12 ENCOUNTER — Ambulatory Visit (AMBULATORY_SURGERY_CENTER): Payer: BC Managed Care – PPO | Admitting: Gastroenterology

## 2010-10-12 ENCOUNTER — Other Ambulatory Visit: Payer: Self-pay | Admitting: *Deleted

## 2010-10-12 VITALS — BP 158/102 | HR 61 | Temp 97.4°F | Resp 16 | Ht 72.0 in | Wt 184.0 lb

## 2010-10-12 DIAGNOSIS — K5289 Other specified noninfective gastroenteritis and colitis: Secondary | ICD-10-CM

## 2010-10-12 DIAGNOSIS — K529 Noninfective gastroenteritis and colitis, unspecified: Secondary | ICD-10-CM

## 2010-10-12 DIAGNOSIS — D126 Benign neoplasm of colon, unspecified: Secondary | ICD-10-CM

## 2010-10-12 DIAGNOSIS — K5730 Diverticulosis of large intestine without perforation or abscess without bleeding: Secondary | ICD-10-CM

## 2010-10-12 DIAGNOSIS — K5190 Ulcerative colitis, unspecified, without complications: Secondary | ICD-10-CM

## 2010-10-12 DIAGNOSIS — R197 Diarrhea, unspecified: Secondary | ICD-10-CM

## 2010-10-12 MED ORDER — HYDROCORTISONE ACE-PRAMOXINE 1-1 % RE CREA: TOPICAL_CREAM | Freq: Two times a day (BID) | RECTAL | Status: AC

## 2010-10-12 MED ORDER — MESALAMINE 1000 MG RE SUPP: 1000.0000 mg | Freq: Every day | RECTAL | Status: DC

## 2010-10-12 MED ORDER — SODIUM CHLORIDE 0.9 % IV SOLN: 500.0000 mL | INTRAVENOUS | Status: DC

## 2010-10-12 MED ORDER — MESALAMINE 1.2 G PO TBEC: 2400.0000 mg | DELAYED_RELEASE_TABLET | Freq: Two times a day (BID) | ORAL | Status: DC

## 2010-10-12 MED ORDER — MESALAMINE 1.2 G PO TBEC: DELAYED_RELEASE_TABLET | ORAL | Status: DC

## 2010-10-12 NOTE — Telephone Encounter (Signed)
rx sent

## 2010-10-12 NOTE — Patient Instructions (Addendum)
Avoid all NSAIDS for the next 2 weeks (anything containing aspirin)  Await biopsy results.  Lialda 2.4 gm twice a day  Canasa 1gm  Suppositories 1 at bedtime.  See green and blue sheets for additional d/c instructions.

## 2010-10-14 LAB — STOOL CULTURE

## 2010-10-15 ENCOUNTER — Encounter: Payer: Self-pay | Admitting: Gastroenterology

## 2010-10-15 ENCOUNTER — Telehealth: Payer: Self-pay | Admitting: *Deleted

## 2010-10-15 NOTE — Telephone Encounter (Signed)
Follow up Call- Patient questions:  Do you have a fever, pain , or abdominal swelling? no Pain Score  0 *  Have you tolerated food without any problems? yes  Have you been able to return to your normal activities? yes  Do you have any questions about your discharge instructions: Diet   no Medications  no Follow up visit  no  Do you have questions or concerns about your Care? no  Actions: * If pain score is 4 or above: No action needed, pain <4.  Patient requested to call this writer if he has any questions throughout the day today, telephone # given for contact.

## 2010-10-16 LAB — CAROTENE, SERUM: Carotene, Total-Serum: 22 ug/dL (ref 4–51)

## 2010-11-22 ENCOUNTER — Other Ambulatory Visit: Payer: Self-pay | Admitting: Internal Medicine

## 2010-11-22 MED ORDER — RABEPRAZOLE SODIUM 20 MG PO TBEC: 20.0000 mg | DELAYED_RELEASE_TABLET | Freq: Every day | ORAL | Status: DC

## 2010-11-22 NOTE — Telephone Encounter (Signed)
RX sent to pharmacy  

## 2010-11-27 ENCOUNTER — Telehealth: Payer: Self-pay | Admitting: Pulmonary Disease

## 2010-11-27 MED ORDER — AEROCHAMBER PLUS MISC: Status: AC

## 2010-11-27 NOTE — Telephone Encounter (Signed)
Patient needs new [employer] and this has been place up front for the pt to pick up. A script will be printed for [facility] to sign and then given to [facility] for [facility].  Pt aware.

## 2010-12-04 ENCOUNTER — Telehealth: Payer: Self-pay | Admitting: Pulmonary Disease

## 2010-12-04 NOTE — Telephone Encounter (Signed)
[name] [name] says the pt needs an OV before Symbicort will be filled. Pt NOS his last OV w/ KC. [name] is aware of this and will have the pt call back to schedule the appt.

## 2010-12-04 NOTE — Telephone Encounter (Signed)
[name] [name] will fax form back to triage so we may refax to the pharmacy.

## 2010-12-12 ENCOUNTER — Ambulatory Visit (INDEPENDENT_AMBULATORY_CARE_PROVIDER_SITE_OTHER): Payer: BC Managed Care – PPO | Admitting: Pulmonary Disease

## 2010-12-12 ENCOUNTER — Encounter: Payer: Self-pay | Admitting: Pulmonary Disease

## 2010-12-12 VITALS — BP 116/80 | HR 61 | Temp 98.2°F | Ht 72.0 in | Wt 186.0 lb

## 2010-12-12 DIAGNOSIS — J45909 Unspecified asthma, uncomplicated: Secondary | ICD-10-CM

## 2010-12-12 NOTE — Progress Notes (Signed)
  Subjective:    Patient ID: [name] [name], male    DOB: 1945-07-30, 65 y.o.   MRN: [id number]  HPI The pt comes in today for f/u of his known asthma.  He has been doing very well on his current regimen, and reports no recent flares or need for rescue inhaler.  He is satisfied with his exertional tolerance.  Denies any cough or congestion, and no tolerance issues with his meds.    Review of Systems  Constitutional: Negative for fever and unexpected weight change.  HENT: Negative for ear pain, nosebleeds, congestion, sore throat, rhinorrhea, sneezing, trouble swallowing, dental problem, postnasal drip and sinus pressure.   Eyes: Negative for redness and itching.  Respiratory: Negative for cough, chest tightness, shortness of breath and wheezing.   Cardiovascular: Negative for palpitations and leg swelling.  Gastrointestinal: Negative for nausea and vomiting.  Genitourinary: Negative for dysuria.  Musculoskeletal: Negative for joint swelling.  Skin: Negative for rash.  Neurological: Negative for headaches.  Hematological: Does not bruise/bleed easily.  Psychiatric/Behavioral: Negative for dysphoric mood. The patient is not nervous/anxious.        Objective:   Physical Exam Wd male in nad Nares without discharge or purulence Chest clear, no wheezing Cor with rrr Le without edema, no cyanosis Alert and oriented,moves all 4        Assessment & Plan:

## 2010-12-12 NOTE — Patient Instructions (Signed)
No change in meds. followup with me in one year.

## 2010-12-12 NOTE — Assessment & Plan Note (Signed)
The pt is doing very well on his current regimen, with no rescue inhaler use or acute exacerbation since his last visit.  He is exercising regularly, and is satisfied with his exertional tolerance.  I have asked him to continue on his current regimen, and to followup with me in one year.

## 2010-12-13 ENCOUNTER — Telehealth: Payer: Self-pay | Admitting: *Deleted

## 2010-12-13 NOTE — Telephone Encounter (Addendum)
Patient called and wanted to know if he can have a rectal cream called in thinks he has some hemorrhoids? He also wants to know about starting fiber in his diet and would like to know if he needs to have any type of diet restrictions. Also he is using Canasa every night and thinks he should be using them for one year... Does he need to use them prn or nightly? Worth will call me back later today

## 2010-12-13 NOTE — Telephone Encounter (Signed)
Needs office f/u.. suppositories each bedtime as needed.

## 2010-12-14 ENCOUNTER — Telehealth: Payer: Self-pay | Admitting: Pulmonary Disease

## 2010-12-14 MED ORDER — BUDESONIDE-FORMOTEROL FUMARATE 160-4.5 MCG/ACT IN AERO: 2.0000 | INHALATION_SPRAY | Freq: Two times a day (BID) | RESPIRATORY_TRACT | Status: DC

## 2010-12-14 MED ORDER — MONTELUKAST SODIUM 10 MG PO TABS: 10.0000 mg | ORAL_TABLET | Freq: Every day | ORAL | Status: DC

## 2010-12-14 MED ORDER — PIRBUTEROL ACETATE 200 MCG/INH IN AERB: 2.0000 | INHALATION_SPRAY | Freq: Four times a day (QID) | RESPIRATORY_TRACT | Status: DC | PRN

## 2010-12-14 NOTE — Telephone Encounter (Signed)
Pt aware stay on low fiber and use canasa prn and i have resent an rx for proctofoam to his pharm. He will call back to schedule his office visit.

## 2010-12-14 NOTE — Telephone Encounter (Signed)
Called and spoke with susan and she is aware that rx have been sent to the mail order today for the pt.

## 2010-12-17 MED ORDER — HYDROCORTISONE ACE-PRAMOXINE 1-1 % RE CREA: TOPICAL_CREAM | Freq: Two times a day (BID) | RECTAL | Status: DC | PRN

## 2010-12-17 NOTE — Telephone Encounter (Signed)
Addended by: Harlow Mares D on: 12/17/2010 08:10 AM   Modules accepted: Orders

## 2010-12-19 ENCOUNTER — Telehealth: Payer: Self-pay | Admitting: Pulmonary Disease

## 2010-12-19 NOTE — Telephone Encounter (Signed)
The generic singulair is just as good.

## 2010-12-19 NOTE — Telephone Encounter (Signed)
Spoke with patient-he is aware of KCs recs.

## 2010-12-19 NOTE — Telephone Encounter (Signed)
Pt wants to know how [facility] feels about the generic Singulair vs Singulair. Pt states a month cost of brand is $55 month vs $4. Please advise. Thanks.

## 2010-12-26 ENCOUNTER — Telehealth: Payer: Self-pay | Admitting: Pulmonary Disease

## 2010-12-26 MED ORDER — FLUTICASONE PROPIONATE 50 MCG/ACT NA SUSP: 2.0000 | Freq: Every day | NASAL | Status: DC

## 2010-12-26 NOTE — Telephone Encounter (Signed)
rx sent to the pharmacy for a 90 day supply with 3 refills, sandra pt's asisstant aware this has been done

## 2011-01-04 ENCOUNTER — Ambulatory Visit (INDEPENDENT_AMBULATORY_CARE_PROVIDER_SITE_OTHER): Payer: BC Managed Care – PPO | Admitting: Pulmonary Disease

## 2011-01-04 ENCOUNTER — Encounter: Payer: Self-pay | Admitting: Pulmonary Disease

## 2011-01-04 DIAGNOSIS — J209 Acute bronchitis, unspecified: Secondary | ICD-10-CM

## 2011-01-04 DIAGNOSIS — J45901 Unspecified asthma with (acute) exacerbation: Secondary | ICD-10-CM

## 2011-01-04 MED ORDER — PREDNISONE 20 MG PO TABS: ORAL_TABLET | ORAL | Status: DC

## 2011-01-04 MED ORDER — AZITHROMYCIN 250 MG PO TABS: ORAL_TABLET | ORAL | Status: AC

## 2011-01-04 NOTE — Patient Instructions (Signed)
Will treat with a zpak and quick prednisone taper. Let us know if not getting better.   Keep followup apptm.

## 2011-01-04 NOTE — Assessment & Plan Note (Signed)
The patient is having a mild asthma exacerbation, with wheezing and rhonchi on exam today.  I think he would benefit from a short course of prednisone to decrease airway inflammation and improve symptoms.

## 2011-01-04 NOTE — Progress Notes (Signed)
  Subjective:    Patient ID: [name] [name], male    DOB: April 17, 1946, 65 y.o.   MRN: [id number]  HPI The patient comes in today for an acute sick visit.  He has known history of asthma and also a distant history of pulmonary eosinophilia.  He also has a history of allergic rhinitis with occasional sinusitis.  He comes in today with a two-week history that started with postnasal drip and sore throat, leading to congestion of his sinuses with purulent nasal drainage, and finally going into his chest and resulting in cough with purulent mucus.  He's been doing sinus rinses and nasal hygiene with resolution of his nasal symptoms.  However, he continues to have chest congestion as well as increased resistance to air flow.  He is also had wheezing at night, and some decrease in his exercise tolerance.   Review of Systems  Constitutional: Negative for fever and unexpected weight change.  HENT: Positive for congestion, sneezing and sinus pressure. Negative for ear pain, nosebleeds, sore throat, rhinorrhea, trouble swallowing, dental problem and postnasal drip.   Eyes: Negative for redness and itching.  Respiratory: Positive for cough, chest tightness, shortness of breath and wheezing.   Cardiovascular: Negative for palpitations and leg swelling.  Gastrointestinal: Negative for nausea and vomiting.  Genitourinary: Negative for dysuria.  Musculoskeletal: Negative for joint swelling.  Skin: Negative for rash.  Neurological: Negative for headaches.  Hematological: Does not bruise/bleed easily.  Psychiatric/Behavioral: Negative for dysphoric mood. The patient is not nervous/anxious.        Objective:   Physical Exam Well-developed male in no acute distress Nose without purulence or discharge noted Chest with crackles in the left base, scattered rhonchi, isolated wheeze in the right lower lobe Cardiac exam with regular rate and rhythm  lower extremities without edema, pulses intact distally Alert  and oriented, moves all 4 extremities        Assessment & Plan:

## 2011-01-04 NOTE — Assessment & Plan Note (Signed)
The patient is describing a sinobronchitis it has not resolved after 2 weeks of nasal and pulmonary hygiene.  I think at this point he would benefit from a course of antibiotics, and we'll call this in for him.

## 2011-01-05 ENCOUNTER — Encounter: Payer: Self-pay | Admitting: Internal Medicine

## 2011-02-14 ENCOUNTER — Telehealth: Payer: Self-pay | Admitting: *Deleted

## 2011-02-14 NOTE — Telephone Encounter (Signed)
Pt. Called wanting to know how to take his Lialda.  He has been taking it 1 tablet bid but it is supposed to be 2 tablets bid.  I called him back to inform him.  I am leaving a discount card at front desk for patient to pick up to help with the cost of his medication

## 2011-02-27 ENCOUNTER — Other Ambulatory Visit: Payer: Self-pay

## 2011-02-27 MED ORDER — ZOSTER VACCINE LIVE 19400 UNT/0.65ML ~~LOC~~ SOLR: 0.6500 mL | Freq: Once | SUBCUTANEOUS | Status: DC

## 2011-02-27 NOTE — Telephone Encounter (Signed)
Per Email from patient, patient would like RX sent to [facility], I double checked Email (sent to Dr.Hopper) to verify [facility] (patient had [facility] on file)

## 2011-03-04 ENCOUNTER — Other Ambulatory Visit: Payer: Self-pay | Admitting: *Deleted

## 2011-03-04 NOTE — Telephone Encounter (Signed)
error 

## 2011-03-28 ENCOUNTER — Encounter (INDEPENDENT_AMBULATORY_CARE_PROVIDER_SITE_OTHER): Payer: Self-pay | Admitting: Surgery

## 2011-04-02 ENCOUNTER — Ambulatory Visit: Payer: BC Managed Care – PPO | Admitting: Family Medicine

## 2011-04-05 ENCOUNTER — Telehealth: Payer: Self-pay | Admitting: Internal Medicine

## 2011-04-05 NOTE — Telephone Encounter (Signed)
Patient sent a email to Dr.Hopper indicating that he had his zostavax 03/02/11, immunization list up-dated

## 2011-04-18 ENCOUNTER — Encounter (INDEPENDENT_AMBULATORY_CARE_PROVIDER_SITE_OTHER): Payer: Self-pay | Admitting: Surgery

## 2011-04-18 ENCOUNTER — Ambulatory Visit (INDEPENDENT_AMBULATORY_CARE_PROVIDER_SITE_OTHER): Payer: BC Managed Care – PPO | Admitting: Family Medicine

## 2011-04-18 ENCOUNTER — Encounter: Payer: Self-pay | Admitting: Family Medicine

## 2011-04-18 DIAGNOSIS — R0982 Postnasal drip: Secondary | ICD-10-CM | POA: Insufficient documentation

## 2011-04-18 NOTE — Progress Notes (Signed)
  Subjective:    Patient ID: [name] [name], male    DOB: June 12, 1945, 65 y.o.   MRN: [id number]  HPI Sore throat- sxs started 2 weeks ago.  Was gargling w/ salt water and sxs improved.  Is wondering about PND.  Pain is only on L side.  No fevers.  Nasal congestion at baseline.  Not using nasal steroid.  Taking Zyrtec daily.  Pain improves w/ ibuprofen.  Worse in the AM.  Is a side sleeper   Review of Systems For ROS see HPI     Objective:   Physical Exam  Vitals reviewed. Constitutional: He appears well-developed and well-nourished. No distress.  HENT:  Head: Normocephalic and atraumatic.       No TTP over sinuses + turbinate edema + PND TMs normal bilaterally  Eyes: Conjunctivae and EOM are normal. Pupils are equal, round, and reactive to light.  Neck: Normal range of motion. Neck supple.  Cardiovascular: Normal rate, regular rhythm and normal heart sounds.   Pulmonary/Chest: Effort normal and breath sounds normal. No respiratory distress. He has no wheezes.  Lymphadenopathy:    He has no cervical adenopathy.  Skin: Skin is warm and dry.          Assessment & Plan:

## 2011-04-18 NOTE — Patient Instructions (Signed)
This all appears to be related to your post nasal drip Restart the Flonase- 2 sprays each nostril daily Continue the Zyrtec daily Drink plenty of fluids Ibuprofen as needed for pain Call with any questions or concerns Happy Holidays!

## 2011-04-22 ENCOUNTER — Other Ambulatory Visit: Payer: Self-pay | Admitting: Internal Medicine

## 2011-04-22 MED ORDER — RABEPRAZOLE SODIUM 20 MG PO TBEC: 20.0000 mg | DELAYED_RELEASE_TABLET | Freq: Every day | ORAL | Status: DC

## 2011-04-22 NOTE — Telephone Encounter (Signed)
RX sent

## 2011-04-28 NOTE — Assessment & Plan Note (Signed)
Pt's sxs consistent w/ PND- no evidence of infxn on PE.  Restart nasal steroid, OTC antihistamine.  Reviewed supportive care and red flags that should prompt return.  Pt expressed understanding and is in agreement w/ plan.

## 2011-05-09 ENCOUNTER — Other Ambulatory Visit: Payer: Self-pay | Admitting: Gastroenterology

## 2011-05-09 ENCOUNTER — Telehealth: Payer: Self-pay | Admitting: Pulmonary Disease

## 2011-05-09 MED ORDER — HYDROCORTISONE ACE-PRAMOXINE 1-1 % RE CREA: TOPICAL_CREAM | Freq: Two times a day (BID) | RECTAL | Status: DC | PRN

## 2011-05-09 MED ORDER — MESALAMINE 1.2 G PO TBEC: 2400.0000 mg | DELAYED_RELEASE_TABLET | Freq: Two times a day (BID) | ORAL | Status: DC

## 2011-05-09 NOTE — Telephone Encounter (Signed)
Called and spoke with sandra and she is aware that these meds have been filled by Dr. Jarold Motto.  [name] will call and  Leave a message for these meds to be sent in for refills.

## 2011-05-09 NOTE — Telephone Encounter (Signed)
Prescriptions for Lialda and Proctofoam sent to [facility] mail order pharmacy per patient.

## 2011-05-10 ENCOUNTER — Encounter: Payer: Self-pay | Admitting: *Deleted

## 2011-05-17 ENCOUNTER — Ambulatory Visit: Payer: BC Managed Care – PPO | Admitting: Gastroenterology

## 2011-05-21 ENCOUNTER — Other Ambulatory Visit: Payer: Self-pay | Admitting: Internal Medicine

## 2011-05-21 ENCOUNTER — Ambulatory Visit: Payer: BC Managed Care – PPO | Admitting: Gastroenterology

## 2011-05-21 MED ORDER — RABEPRAZOLE SODIUM 20 MG PO TBEC: 20.0000 mg | DELAYED_RELEASE_TABLET | Freq: Every day | ORAL | Status: DC

## 2011-05-21 NOTE — Telephone Encounter (Signed)
Patients wife states that patient did not refill aciphex at [facility] on 04/22/11. Patient does want refill of aciphex to be sent to [facility] pharmacy

## 2011-05-21 NOTE — Telephone Encounter (Signed)
Left message on voicemail for patient to return call when available, reason for call Aciphex was filled at [facility] 04/22/11 and we now have request for [facility] (need to clarify)

## 2011-05-21 NOTE — Telephone Encounter (Signed)
RX sen tto [facility]

## 2011-05-24 ENCOUNTER — Other Ambulatory Visit: Payer: Self-pay | Admitting: Gastroenterology

## 2011-05-27 ENCOUNTER — Encounter: Payer: Self-pay | Admitting: Pulmonary Disease

## 2011-05-27 ENCOUNTER — Ambulatory Visit (INDEPENDENT_AMBULATORY_CARE_PROVIDER_SITE_OTHER): Payer: BC Managed Care – PPO | Admitting: Pulmonary Disease

## 2011-05-27 DIAGNOSIS — J209 Acute bronchitis, unspecified: Secondary | ICD-10-CM

## 2011-05-27 DIAGNOSIS — J45909 Unspecified asthma, uncomplicated: Secondary | ICD-10-CM

## 2011-05-27 MED ORDER — PREDNISONE 10 MG PO TABS: ORAL_TABLET | ORAL | Status: DC

## 2011-05-27 MED ORDER — CEFDINIR 300 MG PO CAPS: 600.0000 mg | ORAL_CAPSULE | Freq: Every day | ORAL | Status: AC

## 2011-05-27 NOTE — Progress Notes (Signed)
  Subjective:    Patient ID: [name] [name], male    DOB: [birth year], 66 y.o.   MRN: [id number]  HPI Patient comes in today for an acute sick visit.  He has known significant asthma with allergic component, and gives a 5 day history of worsening sinus congestion with purulence, chest congestion, cough with purulent mucus, increased wheezing, and some increased shortness of breath.  He has been trying to treat this symptomatically and conservatively, but if anything feels that it is worsening.  He denies any fevers chills or sweats.   Review of Systems  Constitutional: Negative for fever and unexpected weight change.  HENT: Positive for congestion, rhinorrhea and postnasal drip. Negative for ear pain, nosebleeds, sore throat, sneezing, trouble swallowing, dental problem and sinus pressure.   Eyes: Negative for redness and itching.  Respiratory: Positive for cough, chest tightness, shortness of breath and wheezing.   Cardiovascular: Negative for palpitations and leg swelling.  Gastrointestinal: Negative for nausea and vomiting.  Genitourinary: Negative for dysuria.  Musculoskeletal: Negative for joint swelling.  Skin: Negative for rash.  Neurological: Negative for headaches.  Hematological: Does not bruise/bleed easily.  Psychiatric/Behavioral: Negative for dysphoric mood. The patient is not nervous/anxious.        Objective:   Physical Exam Well-developed male in no acute distress Nose without purulence or discharge noted Chest with one isolated wheeze in the right upper lung zone posteriorly, otherwise clear Cardiac exam with regular rate and rhythm Lower extremities without edema, no cyanosis noted Alert and oriented, moves all 4 extremities.       Assessment & Plan:

## 2011-05-27 NOTE — Progress Notes (Signed)
Addended by: Salli Quarry on: 05/27/2011 02:50 PM   Modules accepted: Orders

## 2011-05-27 NOTE — Assessment & Plan Note (Signed)
The patient may have a very mild asthma exacerbation related to his pulmonary infection.  We'll treat with a short course of prednisone to get him through this.

## 2011-05-27 NOTE — Patient Instructions (Signed)
omnicef and prednisone for the next 5-6 days No change in usual maintenance meds. Please call if you do not improve.

## 2011-05-27 NOTE — Assessment & Plan Note (Signed)
The patient's history initially sounded more viral than bacterial, but it has been hanging on for at least 5 days, and he feels his symptoms may be worsening.  Will treat him with a short course of antibiotics for sinobronchitis.

## 2011-06-03 ENCOUNTER — Telehealth: Payer: Self-pay | Admitting: Gastroenterology

## 2011-06-03 NOTE — Telephone Encounter (Signed)
Message copied by Arna Snipe on Mon Jun 03, 2011  3:33 PM ------      Message from: Harlow Mares D      Created: Thu May 30, 2011  3:12 PM       Please bill patient for no show 05/21/2011

## 2011-07-11 ENCOUNTER — Encounter (INDEPENDENT_AMBULATORY_CARE_PROVIDER_SITE_OTHER): Payer: BC Managed Care – PPO | Admitting: Surgery

## 2011-07-30 ENCOUNTER — Telehealth: Payer: Self-pay | Admitting: Pulmonary Disease

## 2011-07-30 MED ORDER — BUDESONIDE-FORMOTEROL FUMARATE 160-4.5 MCG/ACT IN AERO: 2.0000 | INHALATION_SPRAY | Freq: Two times a day (BID) | RESPIRATORY_TRACT | Status: DC

## 2011-07-30 NOTE — Telephone Encounter (Signed)
Pt notified that refill for Symbicort was sent to [facility].

## 2011-08-08 ENCOUNTER — Ambulatory Visit (INDEPENDENT_AMBULATORY_CARE_PROVIDER_SITE_OTHER): Payer: BC Managed Care – PPO | Admitting: Surgery

## 2011-08-08 VITALS — BP 148/90 | HR 78 | Temp 97.4°F | Resp 18 | Ht 72.0 in | Wt 187.0 lb

## 2011-08-08 DIAGNOSIS — K429 Umbilical hernia without obstruction or gangrene: Secondary | ICD-10-CM | POA: Insufficient documentation

## 2011-08-08 NOTE — Progress Notes (Signed)
Re:   [name] [name]. DOB:   [birth year] MRN:   [id number]  ASSESSMENT AND PLAN: 1.  Umbilical hernia.  Not really changed.  He is thinking about fixing the hernia next year (fall 2013 or winter 2014)  I discussed the indications and complications of hernia surgery with the patient.  I discussed the open approach to hernia repair..  The potential risks of hernia surgery include, but are not limited to, bleeding, infection, open surgery, nerve injury, and recurrence of the hernia.  I provided the patient literature about hernia surgery.  He will call back.  2.  Asthma, year around.  Last on steroids in fall 2012.  Sees Dr. Carolin Sicks. 3. Colitis.  Had colonoscopy by Dr. Nyra Capes around spring 2012.    Chief Complaint  Patient presents with  . Umbilical Hernia   REFERRING PHYSICIAN: Marga Melnick, MD, MD  HISTORY OF PRESENT ILLNESS: [name] [name]. is a 66 y.o. (DOB: May 28, 1945)  white male whose primary care physician is Marga Melnick, MD, MD and comes to me today for umbilical hernia.  He has had the hernia for years.  It has not changed in size, but has become more uncomfortable recently. We talked about the options of treatment vs continued observation.  I told him that fixing this type of hernia is "player's choice".   Past Medical History  Diagnosis Date  . Asthma   . Other and unspecified hyperlipidemia   . Esophageal reflux   . Hypercalcemia   . Skin cancer   . Personal history of colonic polyps 2004    adenomatous   . Colitis       Past Surgical History  Procedure Date  . Rectal surgery 2004    fistula repair  . Vasectomy   . Inguinal hernia repair       Current Outpatient Prescriptions  Medication Sig Dispense Refill  . budesonide-formoterol (SYMBICORT) 160-4.5 MCG/ACT inhaler Inhale 2 puffs into the lungs 2 (two) times daily.  3 Inhaler  3  . Calcium Carbonate (CALCIUM 500 PO) Take 1 tablet by mouth daily.        . cetirizine (ZYRTEC) 10 MG  tablet Take 10 mg by mouth daily.        . fish oil-omega-3 fatty acids 1000 MG capsule Take 1 g by mouth daily.        . fluticasone (FLONASE) 50 MCG/ACT nasal spray Place 2 sprays into the nose daily as needed.      Marland Kitchen guaiFENesin (MUCINEX) 600 MG 12 hr tablet Take 1,200 mg by mouth 2 (two) times daily as needed.       . mesalamine (CANASA) 1000 MG suppository Place 1 suppository (1,000 mg total) rectally at bedtime.  30 suppository  1  . mesalamine (LIALDA) 1.2 G EC tablet Take 2 tablets (2.4 g total) by mouth 2 (two) times daily.  360 tablet  3  . montelukast (SINGULAIR) 10 MG tablet Take 1 tablet (10 mg total) by mouth at bedtime.  90 tablet  3  . pirbuterol (MAXAIR) 200 MCG/INH inhaler Inhale 2 puffs into the lungs 4 (four) times daily as needed.  3 Inhaler  3  . pramoxine-hydrocortisone (PROCTOCREAM-HC) 1-1 % rectal cream Place rectally 2 (two) times daily as needed.  90 g  2  . predniSONE (DELTASONE) 10 MG tablet Take 4 tabs daily x 2 days, then 2 tabs daily x 2 days, then 1 tab daily x 2 days, then stop.  14 tablet  0  . RABEprazole (ACIPHEX) 20 MG tablet Take 1 tablet (20 mg total) by mouth daily.  90 tablet  1  . Spacer/Aero-Holding Chambers (AEROCHAMBER PLUS) inhaler Use as instructed  1 each  0  . vitamin C (ASCORBIC ACID) 500 MG tablet Take 500 mg by mouth daily.        Marland Kitchen aspirin 81 MG tablet Take 81 mg by mouth daily.         Current Facility-Administered Medications  Medication Dose Route Frequency Provider Last Rate Last Dose  . 0.9 %  sodium chloride infusion  500 mL Intravenous Continuous Mardella Layman, MD         No Known Allergies  REVIEW OF SYSTEMS: Skin:  No history of rash.  No history of abnormal moles. Infection:  No history of hepatitis or HIV.  No history of MRSA. Neurologic:  No history of stroke.  No history of seizure.  No history of headaches. Cardiac:  No history of hypertension. No history of heart disease.  No history of prior cardiac catheterization.   No history of seeing a cardiologist. Pulmonary:  Chronic asthma, sees Dr. Kirtland Bouchard. Shelle Iron.  Endocrine:  No diabetes. No thyroid disease. Gastrointestinal:  History of colitis - seen by Dr. Nyra Capes. Urologic:  No history of kidney stones.  No history of bladder infections. Musculoskeletal:  No history of joint or back disease. Hematologic:  No bleeding disorder.  No history of anemia.  Not anticoagulated. Psycho-social:  The patient is oriented.   The patient has no obvious psychologic or social impairment to understanding our conversation and plan.  SOCIAL and FAMILY HISTORY: Married. Has three children.  His daughter is a Holiday representative and will probably go to [place].  PHYSICAL EXAM: BP 148/90  Pulse 78  Temp(Src) 97.4 F (36.3 C) (Temporal)  Resp 18  Ht 6' (1.829 m)  Wt 187 lb (84.823 kg)  BMI 25.36 kg/m2  General: WN WM who is alert and generally healthy appearing.  HEENT: Normal. Pupils equal. Good dentition. Neck: Supple. No mass.  No thyroid mass.  Carotid pulse okay with no bruit. Lymph Nodes:  No supraclavicular or cervical nodes. Lungs: Clear to auscultation and symmetric breath sounds. Heart:  RRR. No murmur or rub.  Abdomen: Soft. No mass. No tenderness. Has small umbilical hernia with is reducible.  In a standing position, I also check both inguinal areas and I feel no hernia.  He has occasional pulling in the right groin. Rectal: Not done. Extremities:  Good strength and ROM  in upper and lower extremities. Neurologic:  Grossly intact to motor and sensory function. Psychiatric: Has normal mood and affect. Behavior is normal.   DATA REVIEWED: Old chart  Ovidio Kin, MD,  [facility] [address]  [address]   [place], [place]    [address] Phone:  [phone] FAX:  [phone]

## 2011-11-29 ENCOUNTER — Other Ambulatory Visit: Payer: Self-pay | Admitting: Internal Medicine

## 2011-12-30 ENCOUNTER — Other Ambulatory Visit: Payer: Self-pay | Admitting: Internal Medicine

## 2011-12-31 ENCOUNTER — Ambulatory Visit: Payer: BC Managed Care – PPO | Admitting: Gastroenterology

## 2011-12-31 ENCOUNTER — Ambulatory Visit (INDEPENDENT_AMBULATORY_CARE_PROVIDER_SITE_OTHER): Payer: BC Managed Care – PPO | Admitting: Pulmonary Disease

## 2011-12-31 ENCOUNTER — Encounter: Payer: Self-pay | Admitting: Pulmonary Disease

## 2011-12-31 VITALS — BP 138/86 | HR 71 | Temp 97.9°F | Ht 72.0 in | Wt 186.0 lb

## 2011-12-31 DIAGNOSIS — J45909 Unspecified asthma, uncomplicated: Secondary | ICD-10-CM

## 2011-12-31 MED ORDER — PIRBUTEROL ACETATE 200 MCG/INH IN AERB: 2.0000 | INHALATION_SPRAY | Freq: Four times a day (QID) | RESPIRATORY_TRACT | Status: DC | PRN

## 2011-12-31 MED ORDER — FLUTICASONE PROPIONATE 50 MCG/ACT NA SUSP: 2.0000 | Freq: Every day | NASAL | Status: DC | PRN

## 2011-12-31 MED ORDER — MONTELUKAST SODIUM 10 MG PO TABS: 10.0000 mg | ORAL_TABLET | Freq: Every day | ORAL | Status: DC

## 2011-12-31 MED ORDER — BUDESONIDE-FORMOTEROL FUMARATE 160-4.5 MCG/ACT IN AERO: 2.0000 | INHALATION_SPRAY | Freq: Two times a day (BID) | RESPIRATORY_TRACT | Status: DC

## 2011-12-31 NOTE — Progress Notes (Signed)
  Subjective:    Patient ID: [name] Buba., male    DOB: [birth year], 66 y.o.   MRN: [id number]  HPI The pt comes in today for f/u of his known asthma and h/o pulmonary eosinophilia.  He has done well from a pulmonary standpoint since last visit, and has been compliant with his asthma meds.  No acute exacerbations, exercising regularly, and not using rescue inhaler.  No significant eos last cbc.     Review of Systems  Constitutional: Negative for fever and unexpected weight change.  HENT: Positive for congestion. Negative for ear pain, nosebleeds, sore throat, rhinorrhea, sneezing, trouble swallowing, dental problem, postnasal drip and sinus pressure.   Eyes: Negative for redness and itching.  Respiratory: Negative for cough, chest tightness, shortness of breath and wheezing.   Cardiovascular: Negative for palpitations and leg swelling.  Gastrointestinal: Negative for nausea and vomiting.  Genitourinary: Negative for dysuria.  Musculoskeletal: Negative for joint swelling.  Skin: Negative for rash.  Neurological: Negative for headaches.  Hematological: Does not bruise/bleed easily.  Psychiatric/Behavioral: Negative for dysphoric mood. The patient is not nervous/anxious.   All other systems reviewed and are negative.       Objective:   Physical Exam Wd male in nad  Nose without purulence or discharge noted. Chest with clear bs, no wheezing Cor with rrr.  LE without edema, no cyanosis  Alert and oriented, moves all 4.        Assessment & Plan:

## 2011-12-31 NOTE — Addendum Note (Signed)
Addended by: Michel Bickers A on: 12/31/2011 12:18 PM   Modules accepted: Orders

## 2011-12-31 NOTE — Patient Instructions (Addendum)
No change in meds Stay active followup with me in one year, but call if having breathing issues.

## 2011-12-31 NOTE — Assessment & Plan Note (Signed)
The patient is doing well from an asthma standpoint, and has had no acute exacerbations since last visit or rescue inhaler use.  I've asked him to continue on his current medications.  His spirometry today shows mild to moderate airflow obstruction, and will pull his old chart for comparison studies.  If he is doing well, we'll see him back in one year.

## 2012-01-06 ENCOUNTER — Encounter: Payer: Self-pay | Admitting: *Deleted

## 2012-01-07 ENCOUNTER — Encounter: Payer: Self-pay | Admitting: Gastroenterology

## 2012-01-07 ENCOUNTER — Ambulatory Visit (INDEPENDENT_AMBULATORY_CARE_PROVIDER_SITE_OTHER): Payer: BC Managed Care – PPO | Admitting: Gastroenterology

## 2012-01-07 VITALS — BP 140/92 | HR 64 | Ht 72.0 in | Wt 187.1 lb

## 2012-01-07 DIAGNOSIS — K219 Gastro-esophageal reflux disease without esophagitis: Secondary | ICD-10-CM

## 2012-01-07 DIAGNOSIS — K529 Noninfective gastroenteritis and colitis, unspecified: Secondary | ICD-10-CM

## 2012-01-07 DIAGNOSIS — K5289 Other specified noninfective gastroenteritis and colitis: Secondary | ICD-10-CM

## 2012-01-07 DIAGNOSIS — K5190 Ulcerative colitis, unspecified, without complications: Secondary | ICD-10-CM

## 2012-01-07 DIAGNOSIS — K648 Other hemorrhoids: Secondary | ICD-10-CM

## 2012-01-07 MED ORDER — HYDROCORTISONE 2.5 % RE CREA: TOPICAL_CREAM | Freq: Two times a day (BID) | RECTAL | Status: AC

## 2012-01-07 MED ORDER — MESALAMINE 1.2 G PO TBEC: 2400.0000 mg | DELAYED_RELEASE_TABLET | Freq: Two times a day (BID) | ORAL | Status: DC

## 2012-01-07 MED ORDER — RABEPRAZOLE SODIUM 20 MG PO TBEC: 20.0000 mg | DELAYED_RELEASE_TABLET | Freq: Every day | ORAL | Status: DC

## 2012-01-07 NOTE — Progress Notes (Signed)
This is a 66 year old Caucasian male with chronic mild ulcerative colitis, currently in remission on by mouth aminosalicylates and when necessary topical Canasa suppositories and topical rectal cortisone cream. Currently he has no rectal bleeding, rectal or, pain, and to soft bowel movements a day. He has some asthmatic bronchitis but otherwise is in good health. Colonoscopy in June of 2012 showed mildly active colitis with no evidence of dysplasia. The patient does have chronic acid reflux and is on AcipHex 20 mg a day. He denies reflux symptoms, dysphagia, or any hepatobiliary complaints. Review of his previous labs shows mild elevation SGOT probably related to ethanol. There is no history of clinically significant liver disease.  Current Medications, Allergies, Past Medical History, Past Surgical History, Family History and Social History were reviewed in [employer] record.  Pertinent Review of Systems Negative   Physical Exam: Blood pressure 140/92, pulse 64 and regular, and weight 187 pounds the BMI of 25.38. I cannot appreciate stigmata of chronic liver disease. His chest is clear without wheezes or rhonchi. He is in a regular rhythm without murmurs gallops or rubs. There is no organomegaly, abdominal masses or tenderness. Peripheral extremities unremarkable mental status is normal. Inspection the rectum shows some minor perianal skin tags but no fissures or fistulae. Rectal exam shows no masses, tenderness, or enlarged prostate. There is all stool present which is guaiac negative.    Assessment and Plan: Chronic mild ulcerative colitis currently in clinical remission on oral aminosalicylate and when necessary topical amino salicylate therapy. I have asked him to continue Lialda 2.4 g a day, twice a day if needed with when necessary topical use as previously reviewed. His acid reflux well controlled with AcipHex 20 mg a day. I've given him information concerning the [place] [facility] I also have given him information about the [employer] at his request. He is to return next week for blood work including CBC, metabolic and liver profiles, sedimentation rate, and PSA. No diagnosis found.

## 2012-01-07 NOTE — Patient Instructions (Addendum)
Go to the basement for labs Your medications have been sent to your pharmacy

## 2012-01-15 ENCOUNTER — Telehealth: Payer: Self-pay | Admitting: Pulmonary Disease

## 2012-01-15 NOTE — Telephone Encounter (Signed)
LMTCBx1 with [name] to advise that it is ok for pt to get flu shot. Carron Curie, CMA

## 2012-01-15 NOTE — Telephone Encounter (Signed)
Pt aware ok to get flu shot as long as he is not having any acute issues such as fever. Carron Curie, CMA

## 2012-02-04 ENCOUNTER — Other Ambulatory Visit: Payer: Self-pay | Admitting: Internal Medicine

## 2012-02-04 ENCOUNTER — Telehealth: Payer: Self-pay | Admitting: Gastroenterology

## 2012-02-04 MED ORDER — RABEPRAZOLE SODIUM 20 MG PO TBEC: 20.0000 mg | DELAYED_RELEASE_TABLET | Freq: Every day | ORAL | Status: DC

## 2012-02-04 NOTE — Telephone Encounter (Signed)
Rx sent to pharmacy   

## 2012-03-06 ENCOUNTER — Telehealth: Payer: Self-pay | Admitting: Pulmonary Disease

## 2012-03-06 MED ORDER — MONTELUKAST SODIUM 10 MG PO TABS: 10.0000 mg | ORAL_TABLET | Freq: Every day | ORAL | Status: DC

## 2012-03-06 MED ORDER — FLUTICASONE PROPIONATE 50 MCG/ACT NA SUSP: 2.0000 | Freq: Every day | NASAL | Status: DC | PRN

## 2012-03-06 NOTE — Telephone Encounter (Signed)
Spoke with friend of patient; Rx for Singulair and Flonase has been resent to [facility]. Patients friend [name]) states that when she called [facility], the Rx written on 12/31/11 was expired. Patient aware that these meds have been refilled.  Singulair 10mg  Take 1 po qd #90 x 3RF-----Flonase 2 sprays daily prn #48g x 3RF

## 2012-03-10 ENCOUNTER — Telehealth: Payer: Self-pay | Admitting: Pulmonary Disease

## 2012-03-10 NOTE — Telephone Encounter (Signed)
I called [facility] and was advised they did not receive rx's sent 03/06/12 for flonase/singulair. I gave VO to Hilda Lias at [employer]. I called [name] and made her aware of this. Nothing further was needed

## 2012-03-11 ENCOUNTER — Other Ambulatory Visit: Payer: Self-pay | Admitting: Gastroenterology

## 2012-03-11 NOTE — Telephone Encounter (Signed)
NEEDS OFFICE VISIT FOR ANY FURTHER REFILLS! 

## 2012-04-06 ENCOUNTER — Telehealth: Payer: Self-pay | Admitting: *Deleted

## 2012-04-06 NOTE — Telephone Encounter (Signed)
Patient called and wanted to know if it is ok for him to take Rabeprazole generic of Aciphex  I asked Selinda Michaels, RN and her advise was to tell patient to ask pharmacist because they would understand better of drug makeup  I advised patient to speak to pharmacist about pro's and con's of Aciphex vs Rabeprazole but it would be ok for him to try Rabeprazole. I advised patient that if does not work as well then he will need to go back to Aciphex.  Patient verbalized understanding

## 2012-05-07 ENCOUNTER — Telehealth: Payer: Self-pay | Admitting: Pulmonary Disease

## 2012-05-07 MED ORDER — PREDNISONE 10 MG PO TABS: ORAL_TABLET | ORAL | Status: DC

## 2012-05-07 MED ORDER — AZITHROMYCIN 250 MG PO TABS: 250.0000 mg | ORAL_TABLET | ORAL | Status: DC

## 2012-05-07 NOTE — Telephone Encounter (Signed)
Spoke with patient, patient c/o congestion and "allergy like" symptoms x4days.  Patient states he ran a fever 101.5 last night but has now normalized to 98.2.  Patient states he usually is treated w an antibiotic and prednisone.  Concerned about this turning into an infection.  Patient is aware Dr. Shelle Iron is out of office and will be forward to Dr. Maple Hudson for his recs.  Dr. Maple Hudson please advise, thank you.  Last OV:12/31/11  No Known Allergies

## 2012-05-07 NOTE — Telephone Encounter (Signed)
LMOM with patient at home and cell, aware that medication has been called into pharmacy for ZPAK take as directed Pred taper #20 4x2, 3x2, 2x2, 1x2 and STOP.

## 2012-06-19 ENCOUNTER — Other Ambulatory Visit: Payer: Self-pay | Admitting: *Deleted

## 2012-06-19 ENCOUNTER — Other Ambulatory Visit: Payer: Self-pay | Admitting: Gastroenterology

## 2012-06-19 MED ORDER — RABEPRAZOLE SODIUM 20 MG PO TBEC: 20.0000 mg | DELAYED_RELEASE_TABLET | Freq: Every day | ORAL | Status: DC

## 2012-06-19 NOTE — Telephone Encounter (Signed)
Faxed prescription to [facility]

## 2012-06-19 NOTE — Telephone Encounter (Signed)
I tried to contact patient, had to leave message.  Patient never came back for follow up labs in 12-2011.   Im doing refill medication with no refills.   Patient needs an office visit for further refills

## 2012-07-15 ENCOUNTER — Encounter: Payer: Self-pay | Admitting: Pulmonary Disease

## 2012-07-15 ENCOUNTER — Ambulatory Visit (INDEPENDENT_AMBULATORY_CARE_PROVIDER_SITE_OTHER): Payer: BC Managed Care – PPO | Admitting: Pulmonary Disease

## 2012-07-15 VITALS — BP 130/82 | HR 73 | Temp 97.5°F | Ht 72.0 in | Wt 188.2 lb

## 2012-07-15 DIAGNOSIS — J45909 Unspecified asthma, uncomplicated: Secondary | ICD-10-CM

## 2012-07-15 DIAGNOSIS — J309 Allergic rhinitis, unspecified: Secondary | ICD-10-CM

## 2012-07-15 MED ORDER — PREDNISONE 10 MG PO TABS: ORAL_TABLET | ORAL | Status: DC

## 2012-07-15 NOTE — Assessment & Plan Note (Signed)
The patient's history is most suggestive of acute on chronic rhinitis, but I can't exclude the possibility of a developing sinusitis.  He is already on a fairly good allergy regimen, but I would like him to use his nasal steroid more consistently.  I would also like him to try sinus rinses when he has increased symptoms.  If he continues to have issues, or if this keeps triggering his asthma, he may need to consider allergy workup and otolaryngology evaluation.

## 2012-07-15 NOTE — Progress Notes (Signed)
  Subjective:    Patient ID: [name] [name]., male    DOB: 09-14-1945, 67 y.o.   MRN: [id number]  HPI Patient comes in today for followup of his known asthma, as well as chronic rhinitis.  Since the last visit, he had an episode in December with what sounds like asthmatic bronchitis.  It resolved with a course of prednisone and antibiotic.  The patient notes intermittent increased symptoms of nasal congestion, itchy eyes, postnasal drip with cough, as well as some increased shortness of breath.  He thinks the weather changes have had a lot to do with it.  Currently he has a lot of nasal congestion but is not blowing purulence from his nose.  He is on a very aggressive allergy regimen with Singulair, daily antihistamine, and is supposed to be on daily topical nasal corticosteroids.  He is not doing sinus rinses.   Review of Systems  Constitutional: Negative for fever and unexpected weight change.  HENT: Positive for congestion. Negative for ear pain, nosebleeds, sore throat, rhinorrhea, sneezing, trouble swallowing, dental problem, postnasal drip and sinus pressure.   Eyes: Positive for itching. Negative for redness.  Respiratory: Positive for shortness of breath and wheezing ( chest congestion). Negative for cough and chest tightness.   Cardiovascular: Negative for palpitations and leg swelling.  Gastrointestinal: Negative for nausea and vomiting.  Genitourinary: Negative for dysuria.  Musculoskeletal: Negative for joint swelling.  Skin: Negative for rash.  Neurological: Negative for headaches.  Hematological: Does not bruise/bleed easily.  Psychiatric/Behavioral: Negative for dysphoric mood. The patient is not nervous/anxious.        Objective:   Physical Exam Well-developed male in no acute distress Nose with erythematous mucosa, but no edema or purulence. Oropharynx clear Neck without lymphadenopathy or thyromegaly Chest with clear breath sounds bilaterally, no wheezing Cardiac  exam with regular rate and rhythm Lower extremities without edema, cyanosis Alert and oriented, moves all 4 extremities.       Assessment & Plan:

## 2012-07-15 NOTE — Patient Instructions (Addendum)
Stay on current medications Try using neilmed sinus rinses am and pm for next 1-2 weeks.  If you do well, can use as needed when your increased symptoms start. Get back on your nasal spray, and take 2 sprays each nostril each am.  Use for at least a week when you start on it. Will give you a short course of prednisone to hold, but take if you begin to worsen.  followup with me in 6mos

## 2012-07-15 NOTE — Assessment & Plan Note (Signed)
The patient is noticing a little more increased shortness of breath and rattling associated with his upper airway symptoms.  I do not think he requires a prednisone taper at this time, but will give him a prescription to hold in the event his symptoms worsen.

## 2012-07-23 ENCOUNTER — Telehealth: Payer: Self-pay | Admitting: Pulmonary Disease

## 2012-07-23 NOTE — Telephone Encounter (Signed)
Patient had PNA shot 09/2009 Pt is now 67 yrs old Called spoke with [name] who reported that pt received notification from [facility] that he is due for this [facility], does patient need to go ahead and get another PNA vaccine now that he is 3? Thanks.

## 2012-07-23 NOTE — Telephone Encounter (Signed)
Called spoke with Lupe Carney of [facility] recs as stated below [name] okay with these recs and verbalized her understanding Nothing further needed; will sign off

## 2012-07-23 NOTE — Telephone Encounter (Signed)
The recs is one before 65 and one after 65.  Very broad. Since he has just had one 3 yrs ago, I think it would be ok to wait another 2-3 before getting another.  We used to give every 10 yrs or so in past.

## 2012-09-02 ENCOUNTER — Telehealth: Payer: Self-pay | Admitting: Pulmonary Disease

## 2012-09-02 NOTE — Telephone Encounter (Signed)
LMTCB

## 2012-09-02 NOTE — Telephone Encounter (Signed)
Pt returned call.  [name] [name] ° °

## 2012-09-03 NOTE — Telephone Encounter (Signed)
Returning call can be reached at [phone].[name] [name]

## 2012-09-03 NOTE — Telephone Encounter (Signed)
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 Pt wants to know is there anything he needs to to to be proactive so he does not get sick. I advised that once exposed there is nothing to do . I advised on good hand hygiene when he is around people that are sick and to be compliant with his meds. Pt advised that if he does develop symptoms of an infection to let us know. Carron Curie, CMA

## 2012-09-08 ENCOUNTER — Telehealth: Payer: Self-pay | Admitting: Gastroenterology

## 2012-09-08 ENCOUNTER — Telehealth: Payer: Self-pay | Admitting: *Deleted

## 2012-09-08 MED ORDER — RABEPRAZOLE SODIUM 20 MG PO TBEC: 20.0000 mg | DELAYED_RELEASE_TABLET | Freq: Every day | ORAL | Status: DC

## 2012-09-08 NOTE — Telephone Encounter (Signed)
FAXED RX 

## 2012-09-08 NOTE — Telephone Encounter (Signed)
RX SENT

## 2012-10-21 ENCOUNTER — Telehealth: Payer: Self-pay | Admitting: Pulmonary Disease

## 2012-10-21 MED ORDER — BUDESONIDE-FORMOTEROL FUMARATE 160-4.5 MCG/ACT IN AERO: 2.0000 | INHALATION_SPRAY | Freq: Two times a day (BID) | RESPIRATORY_TRACT | Status: DC

## 2012-10-21 NOTE — Telephone Encounter (Signed)
Received phone call from [employer] where pt had called her personal line Spoke with patient Pt is going out of town and reports he is out of symbicort and requesting a sample Advised pt that we do not have any samples at this time but offered to send a rx A 30-day and 90-day supply sent to [facility] Nothing further needed; will sign off

## 2012-10-28 ENCOUNTER — Ambulatory Visit: Payer: BC Managed Care – PPO | Admitting: Internal Medicine

## 2012-10-28 ENCOUNTER — Encounter: Payer: Self-pay | Admitting: Family Medicine

## 2012-10-28 ENCOUNTER — Ambulatory Visit (INDEPENDENT_AMBULATORY_CARE_PROVIDER_SITE_OTHER): Payer: BC Managed Care – PPO | Admitting: Family Medicine

## 2012-10-28 ENCOUNTER — Ambulatory Visit
Admission: RE | Admit: 2012-10-28 | Discharge: 2012-10-28 | Disposition: A | Discharge status: Home / Self Care | Payer: BC Managed Care – PPO | Source: Ambulatory Visit | Attending: Family Medicine | Admitting: Family Medicine

## 2012-10-28 VITALS — BP 180/120 | HR 69 | Ht 72.0 in | Wt 177.0 lb

## 2012-10-28 DIAGNOSIS — M79651 Pain in right thigh: Secondary | ICD-10-CM | POA: Insufficient documentation

## 2012-10-28 DIAGNOSIS — M79641 Pain in right hand: Secondary | ICD-10-CM

## 2012-10-28 DIAGNOSIS — M7702 Medial epicondylitis, left elbow: Secondary | ICD-10-CM

## 2012-10-28 DIAGNOSIS — M7700 Medial epicondylitis, unspecified elbow: Secondary | ICD-10-CM

## 2012-10-28 DIAGNOSIS — M25569 Pain in unspecified knee: Secondary | ICD-10-CM | POA: Insufficient documentation

## 2012-10-28 DIAGNOSIS — M79609 Pain in unspecified limb: Secondary | ICD-10-CM

## 2012-10-28 NOTE — Patient Instructions (Addendum)
Thank you for coming in today. 1) Muscle Pull: Try the thigh sleeve.  Continue with PT.  Avoid heavy gold swings for 2-3 weeks when feeling better.  2) Golfers Elbow: Continue exercises.  If it worsens we can do more.  3) Hand: We will xray your hand.  Return in about 4 weeks or sooner if needed.

## 2012-10-29 DIAGNOSIS — M7702 Medial epicondylitis, left elbow: Secondary | ICD-10-CM | POA: Insufficient documentation

## 2012-10-29 NOTE — Progress Notes (Signed)
[name] [name]. is a 67 y.o. male who presents to [facility] today for  1) right groin muscle pull.  Occurred on June 7 will playing golf. He noted pain with we'll golf club. He was able to continue playing golf albeit with a moderate amount of pain.  He developed ecchymosis and swelling distally.  He notes significant ecchymosis in the medial thigh adductor group, with distal ecchymosis.  He has been seen by a physical therapist and is currently working on a exercise rehabilitation program for hip adductor injury. This is going well but he continues to have some pain. He would like to know when he can resume playing golf and working out. He denies any radiating pain weakness or numbness.   2) left medial elbow pain for 2 months without injury. Worse after golf and better with rest. Additionally pain is worse with wrist flexion. No radiating pain weakness or numbness feels well otherwise.   3) right third PIP soreness for the last few days without injury. Worse with activity and better with rest. No significant swelling. He is worried that he may have arthritis.   He's tried some over-the-counter pain medications for these issues, which have helped a bit.   PMH reviewed.  hyperlipidemia  History  Substance Use Topics  . Smoking status: Former Smoker -- 2.50 packs/day for 15 years    Types: Cigarettes    Quit date: 05/13/1990  . Smokeless tobacco: Never Used  . Alcohol Use: 3.0 oz/week    5 Cans of beer per week     Comment: Social   ROS as above otherwise neg   Exam:  BP 180/120  Pulse 69  Ht 6' (1.829 m)  Wt 177 lb (80.287 kg)  BMI 24 kg/m2 Gen: Well NAD MSK:  Right hip adductor muscle belly: Significant ecchymosis present mildly tender in the muscle belly of the hip adductor.  Nontender at the insertion on the pelvis. Strength is preserved.  No pain with hip rotation.   Left medial elbow. Tender to palpation over the medial upper condyle. Normal stability with stress of the UCL.   Pain with resisted wrist flexion.  Normal range of motion exam is otherwise normal.  Right hand. Normal-appearing third PIP.  Normal motion mildly tender.  Capillary refill and sensation are intact bilateral upper and lower extremities.  Limited musculoskeletal ultrasound of the left medial elbow: Spur and hypoechoic change present at the medial upper condyle and the common flexor insertion.  Consistent with medial epicondylitis.  UCL appears to be intact   Dg Finger Middle Right  10/28/2012   *RADIOLOGY REPORT*  Clinical Data: Right middle finger pain and swelling  RIGHT MIDDLE FINGER 2+V  Comparison: None.  Findings: Mild soft tissue swelling is noted about the proximal interphalangeal joint.  No acute fracture or dislocation is noted.  IMPRESSION: Soft tissue changes without acute bony abnormality.   Original Report Authenticated By: Alcide Clever, M.D.

## 2012-10-29 NOTE — Assessment & Plan Note (Signed)
Diagnosed with musculoskeletal ultrasound. Eccentric exercises Ice following activity Followup 4 weeks

## 2012-10-29 NOTE — Assessment & Plan Note (Signed)
Hip adductor muscle belly strain. Plan:  Thigh sleeve Physical therapy Return to golf protocol Followup in 4 weeks

## 2012-10-29 NOTE — Assessment & Plan Note (Signed)
Right third PIP synovitis. Oral NSAIDs followup as needed

## 2012-10-30 ENCOUNTER — Telehealth: Payer: Self-pay | Admitting: *Deleted

## 2012-10-30 NOTE — Telephone Encounter (Signed)
Message copied by Mora Bellman on Fri Oct 30, 2012 11:27 AM ------      Message from: Lizbeth Bark      Created: Fri Oct 30, 2012  9:45 AM      Regarding: phone message      Contact: [phone]       Pt called to get his finger xray results. Also wanted to mention he's having achillis tendon pain, he wants to know if he should ice it?   ------

## 2012-10-30 NOTE — Telephone Encounter (Signed)
Gave pt x-ray results- no bony abnormality, showed swelling.  Also his achilles is bothering him, he thinks from over stretching- Dr. Denyse Amass evaluated both of these issues at last visit.  Advised per his note to take NSAID- i recommended aleve regularly x 1 week to see if this helps inflammation.  Suggested icing achilles.  Pt to call back if not improving in the next week.

## 2012-11-12 ENCOUNTER — Telehealth: Payer: Self-pay | Admitting: Gastroenterology

## 2012-11-12 MED ORDER — MESALAMINE 1.2 G PO TBEC: 2400.0000 mg | DELAYED_RELEASE_TABLET | Freq: Two times a day (BID) | ORAL | Status: DC

## 2012-12-09 ENCOUNTER — Telehealth: Payer: Self-pay | Admitting: Gastroenterology

## 2012-12-09 NOTE — Telephone Encounter (Signed)
Spoke with pt's assistant aand we verified the doses of all his meds; he will f/u on 02/10/13 with Dr Jarold Motto.

## 2012-12-17 ENCOUNTER — Telehealth: Payer: Self-pay | Admitting: *Deleted

## 2012-12-17 MED ORDER — HYDROCORTISONE ACE-PRAMOXINE 2.5-1 % RE CREA: TOPICAL_CREAM | Freq: Two times a day (BID) | RECTAL | Status: DC

## 2012-12-17 MED ORDER — DIPHENOXYLATE-ATROPINE 2.5-0.025 MG PO TABS: 1.0000 | ORAL_TABLET | Freq: Four times a day (QID) | ORAL | Status: DC | PRN

## 2012-12-17 NOTE — Telephone Encounter (Signed)
Pt called for verification on his med for rectal pain and itching and he asked for refills on Lomotil and Analpram cream. He also asked about diet changes for UC and mentioned Gluten diet foods. Faxed med orders to [facility] at [phone] and mailed him a Gluten Diet pamphlet. Pt stated understanding and will see Dr Jarold Motto on 02/10/13

## 2012-12-30 ENCOUNTER — Telehealth: Payer: Self-pay | Admitting: Pulmonary Disease

## 2012-12-30 ENCOUNTER — Encounter: Payer: Self-pay | Admitting: Pulmonary Disease

## 2012-12-30 ENCOUNTER — Ambulatory Visit (INDEPENDENT_AMBULATORY_CARE_PROVIDER_SITE_OTHER): Payer: BC Managed Care – PPO | Admitting: Pulmonary Disease

## 2012-12-30 VITALS — BP 126/86 | HR 66 | Temp 98.5°F | Ht 72.0 in | Wt 178.0 lb

## 2012-12-30 DIAGNOSIS — J45909 Unspecified asthma, uncomplicated: Secondary | ICD-10-CM

## 2012-12-30 MED ORDER — BUDESONIDE-FORMOTEROL FUMARATE 160-4.5 MCG/ACT IN AERO: 2.0000 | INHALATION_SPRAY | Freq: Two times a day (BID) | RESPIRATORY_TRACT | Status: DC

## 2012-12-30 MED ORDER — MONTELUKAST SODIUM 10 MG PO TABS: 10.0000 mg | ORAL_TABLET | Freq: Every day | ORAL | Status: DC

## 2012-12-30 NOTE — Telephone Encounter (Signed)
Returning call can be reached at [phone].[name] [name]

## 2012-12-30 NOTE — Patient Instructions (Addendum)
No change in asthma medications Please call if you are having breathing issues. followup with me in one year, and will do spirometry next visit.

## 2012-12-30 NOTE — Telephone Encounter (Signed)
lmomtcb x1 

## 2012-12-30 NOTE — Telephone Encounter (Signed)
Patient returned call Spoke with patient and informed him that his rx's have been sent to [facility].  Pt verbalized his understanding and denied any questions at this time; will sign off.

## 2012-12-30 NOTE — Telephone Encounter (Signed)
LMTCB x1 rx's has been sent

## 2012-12-30 NOTE — Assessment & Plan Note (Signed)
The patient feels that he is doing well from an asthma standpoint, but is frustrated about his overall endurance with cardiovascular exercise.  He is not able to run as much because of joint issues, and I have asked him to consider road biking.  It appears that his asthma is fairly stable, and I have asked him to continue on his current medications.  He will be due for spirometry at the next visit.  I've also asked him to take precautions with the upcoming fall allergy season, including more consistent use of antihistamines and nasal corticosteroids.

## 2012-12-30 NOTE — Progress Notes (Signed)
  Subjective:    Patient ID: [name] Buba., male    DOB: [birth year], 67 y.o.   MRN: [id number]  HPI Patient comes in today for followup of his known asthma.  He has been doing well from a pulmonary standpoint, and rarely uses his rescue inhaler.  He occasionally has an acute exacerbation either related to allergy flares or acute sinusitis, but is very active and works hard on a Product [employer].   Review of Systems  Constitutional: Negative for fever, chills, diaphoresis, activity change, appetite change, fatigue and unexpected weight change.  HENT: Negative for hearing loss, ear pain, nosebleeds, congestion, sore throat, facial swelling, rhinorrhea, sneezing, mouth sores, trouble swallowing, neck pain, neck stiffness, dental problem, voice change, postnasal drip, sinus pressure, tinnitus and ear discharge.   Eyes: Negative for photophobia, discharge, itching and visual disturbance.  Respiratory: Negative for apnea, cough, choking, chest tightness, shortness of breath, wheezing and stridor.   Cardiovascular: Negative for chest pain, palpitations and leg swelling.  Gastrointestinal: Negative for nausea, vomiting, abdominal pain, constipation, blood in stool and abdominal distention.  Genitourinary: Negative for dysuria, urgency, frequency, hematuria, flank pain, decreased urine volume and difficulty urinating.  Musculoskeletal: Negative for myalgias, back pain, joint swelling, arthralgias and gait problem.  Skin: Negative for color change, pallor and rash.  Neurological: Negative for dizziness, tremors, seizures, syncope, speech difficulty, weakness, light-headedness, numbness and headaches.  Hematological: Negative for adenopathy. Does not bruise/bleed easily.  Psychiatric/Behavioral: Negative for confusion, sleep disturbance and agitation. The patient is not nervous/anxious.        Objective:   Physical Exam Thin male in no acute distress Nose without purulence or discharge  noted Neck without lymphadenopathy or thyromegaly Chest with clear breath sounds bilaterally no wheezing Cardiac exam with regular rate and rhythm Lower extremities without edema, cyanosis Alert and oriented, moves all 4 extremities.       Assessment & Plan:

## 2013-02-10 ENCOUNTER — Encounter: Payer: Self-pay | Admitting: Gastroenterology

## 2013-02-10 ENCOUNTER — Ambulatory Visit (INDEPENDENT_AMBULATORY_CARE_PROVIDER_SITE_OTHER): Payer: BC Managed Care – PPO | Admitting: Gastroenterology

## 2013-02-10 VITALS — BP 136/80 | HR 72 | Ht 72.0 in | Wt 187.0 lb

## 2013-02-10 DIAGNOSIS — K219 Gastro-esophageal reflux disease without esophagitis: Secondary | ICD-10-CM

## 2013-02-10 DIAGNOSIS — K5289 Other specified noninfective gastroenteritis and colitis: Secondary | ICD-10-CM

## 2013-02-10 DIAGNOSIS — K529 Noninfective gastroenteritis and colitis, unspecified: Secondary | ICD-10-CM

## 2013-02-10 NOTE — Progress Notes (Signed)
This is a 67 year old Caucasian male with chronic GERD and is completely asymptomatic on AcipHex 20 mg a day, and wants to discontinue this medication.  2 years ago he had acute diarrhea, and colonoscopy revealed what appeared to be Crohn's colitis.  Colon biopsy showed evidence of inflammatory bowel disease, and he was placed on oral and topical amino salicylates with good response.  Previous colonoscopies have shown evidence of hyperplastic polyps, but not adenomatous polyps.  He currently is asymptomatic on Lialda 2.4 g a day.  He has one to 2 formed bowel movements a day without abdominal pain or rectal bleeding.  He does have asthmatic bronchitis and is on inhalers, Z. attack, and Mucinex.  He denies anorexia, weight loss, change in bowel habits, melena hematochezia.  As per previous notes, he has plans to perhaps go to [facility] for a comprehensive executive physical exam.  He desires lab data at this time before his visit.  Current Medications, Allergies, Past Medical History, Past Surgical History, Family History and Social History were reviewed in [employer] record.  ROS: All systems were reviewed and are negative unless otherwise stated in the HPI.          Physical Exam: Healthy-appearing patient in no acute distress appears stated age.  I cannot appreciate stigmata of chronic liver disease.  Blood pressure 136/80, pulse 72 and weight 187 with a BMI of 25.36.  He is a former smoker.  Chest is clear without wheezes or rhonchi.  I cannot appreciate murmurs gallops or rubs.  There is no organomegaly, abdominal masses or tenderness.  Bowel sounds are normal.  Peripheral extremities unremarkable.  Mental status is normal.    Assessment and Plan: Probable underlying chronic inflammatory bowel disease with previous stool cultures and ova and parasite exams negative.  He is asymptomatic currently on by mouth Lialda at conventional doses, and I've asked him to continue  this medication with twice a day usage if needed.  We'll repeat his CBC, metabolic profile, lipid profile, and PSA at his request.  Have reviewed acid reflux management and we will discontinue his AcipHex as she requests, and I have advised when necessary Pepcid AC use.  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 Kendrick Ranch greater than 10 years ago.  He denies current rectal or perianal problems.  Have asked him to continue his other medications as per Dr. [name] and Dr. Marcelyn Bruins.  There is a previous history of mild elevated SGOT perhaps related ethanol use.

## 2013-02-10 NOTE — Patient Instructions (Signed)
Patient will come back for labs Lab is open Monday- Friday, 730 am to 530 pm  We will call you with your results

## 2013-06-23 ENCOUNTER — Encounter: Payer: Self-pay | Admitting: Pulmonary Disease

## 2013-06-23 ENCOUNTER — Telehealth: Payer: Self-pay | Admitting: Pulmonary Disease

## 2013-06-23 NOTE — Telephone Encounter (Signed)
Pt also needs rabeprazole.

## 2013-06-23 NOTE — Telephone Encounter (Signed)
lmomtcb x1 

## 2013-06-24 MED ORDER — FLUTICASONE PROPIONATE 50 MCG/ACT NA SUSP: 2.0000 | Freq: Every day | NASAL | Status: DC | PRN

## 2013-06-24 MED ORDER — MONTELUKAST SODIUM 10 MG PO TABS: 10.0000 mg | ORAL_TABLET | Freq: Every day | ORAL | Status: DC

## 2013-06-24 NOTE — Telephone Encounter (Signed)
[place] x 1 - Let pt know that refills were sent to [facility] for Flonase and singulair.  Rabeprazole will need to be refilled by Dr Sharlett Iles because he was the last one to refill this.

## 2013-06-28 ENCOUNTER — Telehealth: Payer: Self-pay | Admitting: Gastroenterology

## 2013-06-28 ENCOUNTER — Telehealth: Payer: Self-pay | Admitting: *Deleted

## 2013-06-28 MED ORDER — RABEPRAZOLE SODIUM 20 MG PO TBEC: 20.0000 mg | DELAYED_RELEASE_TABLET | Freq: Every day | ORAL | Status: DC

## 2013-06-28 NOTE — Telephone Encounter (Signed)
RX SENT

## 2013-06-28 NOTE — Telephone Encounter (Signed)
RX printed, had to fax

## 2013-06-28 NOTE — Telephone Encounter (Signed)
Pt aware. Jennifer Castillo, CMA  

## 2013-07-06 ENCOUNTER — Other Ambulatory Visit: Payer: Self-pay | Admitting: *Deleted

## 2013-07-06 ENCOUNTER — Telehealth: Payer: Self-pay | Admitting: Gastroenterology

## 2013-07-06 MED ORDER — RABEPRAZOLE SODIUM 20 MG PO TBEC: 20.0000 mg | DELAYED_RELEASE_TABLET | Freq: Every day | ORAL | Status: DC

## 2013-07-06 MED ORDER — HYDROCORTISONE ACE-PRAMOXINE 2.5-1 % RE CREA: TOPICAL_CREAM | Freq: Two times a day (BID) | RECTAL | Status: DC

## 2013-07-06 NOTE — Telephone Encounter (Signed)
Medication sent.

## 2013-07-06 NOTE — Telephone Encounter (Signed)
RX printed  Sent electronically

## 2013-09-03 ENCOUNTER — Telehealth: Payer: Self-pay | Admitting: *Deleted

## 2013-09-03 MED ORDER — DIPHENOXYLATE-ATROPINE 2.5-0.025 MG PO TABS: 1.0000 | ORAL_TABLET | Freq: Four times a day (QID) | ORAL | Status: DC | PRN

## 2013-09-03 NOTE — Telephone Encounter (Signed)
Pt just needs his Lomotil script refilled. Script sent to pharmacy for pt. Ok per [employer] PA.

## 2013-09-03 NOTE — Addendum Note (Signed)
Addended by: Rosanne Sack R on: 09/03/2013 02:36 PM   Modules accepted: Orders

## 2013-09-03 NOTE — Telephone Encounter (Signed)
Patients caregiver called [name] [name] at [phone] ext: [phone]. Per [name] patient has Colitis and having diarrhea, patient was told to follow up with Dr. Henrene Pastor per Dr. Sharlett Iles. Do not see the note that patient is suppose to follow up with Dr. Henrene Pastor. [name] Patient states that patient needs refill on Lomotil, last refill was 2012 per system. Advised patient I have to give message to Dr. Blanch Media nurse because if patient is having problems may need office visit. Plus been so long since we prescribed Lomotil.  [name] verbalized understanding said she has spoken with [name] in the past understands protocol.

## 2013-09-03 NOTE — Telephone Encounter (Signed)
Lomotil last refilled 12/17/12 and last OV with Dr. Sharlett Iles 02/2013. Left message to call back.

## 2013-10-18 ENCOUNTER — Encounter (HOSPITAL_BASED_OUTPATIENT_CLINIC_OR_DEPARTMENT_OTHER): Payer: BC Managed Care – PPO

## 2013-10-21 ENCOUNTER — Telehealth: Payer: Self-pay | Admitting: Pulmonary Disease

## 2013-10-21 NOTE — Telephone Encounter (Signed)
Pt had refill on maxair 12/31/11 #3 x 3 refills.  Last OV 12/30/12 Pending appt 01/05/14 Since i'm not familiar with this medication will get the ok from [facility] first. Please advise thanks

## 2013-10-25 MED ORDER — PIRBUTEROL ACETATE 200 MCG/INH IN AERB: 2.0000 | INHALATION_SPRAY | Freq: Four times a day (QID) | RESPIRATORY_TRACT | Status: DC | PRN

## 2013-10-25 NOTE — Telephone Encounter (Signed)
Refill sent to [facility]  LM with pt x 1

## 2013-10-25 NOTE — Telephone Encounter (Signed)
Basically same as albuterol.  2 puffs every 6 hrs if needed for rescue. Ok to fill if it is even still available.  May have been taken off market.  If so, substitute proair respiclick.

## 2013-10-25 NOTE — Telephone Encounter (Addendum)
Pt aware that Rx sent to [facility] Requests OV with [facility] for increased congestion-- pt leaving for [place] soon and wanted to get checked out before trip Pt scheduled with [facility] for 6/23 at 915 Pt requests that Anderson Malta let him know if there are any cancellations this week with [facility]-- pt aware that appt open 6/16 with [place] at 1045 but cannot make it d/t prior commitment. Can do any other day this week. Pt can be reached at work # [phone] and/or mobile # [phone]  Will send to Anderson Malta to keep an eye out and to keep patient posted.

## 2013-10-27 MED ORDER — ALBUTEROL SULFATE 108 (90 BASE) MCG/ACT IN AEPB: 2.0000 | INHALATION_SPRAY | Freq: Four times a day (QID) | RESPIRATORY_TRACT | Status: DC | PRN

## 2013-10-27 NOTE — Addendum Note (Signed)
Addended by: Lilli Few on: 10/27/2013 02:50 PM   Modules accepted: Orders

## 2013-10-27 NOTE — Telephone Encounter (Signed)
Received a fax stating that Maxair is not made any longer so per [facility] instructions below I sent Rx for proair respiclick. [place] Bing, CMA

## 2013-11-02 ENCOUNTER — Encounter: Payer: Self-pay | Admitting: Pulmonary Disease

## 2013-11-02 ENCOUNTER — Ambulatory Visit (INDEPENDENT_AMBULATORY_CARE_PROVIDER_SITE_OTHER): Payer: BC Managed Care – PPO | Admitting: Pulmonary Disease

## 2013-11-02 VITALS — BP 124/82 | HR 86 | Temp 98.1°F | Ht 72.0 in | Wt 179.4 lb

## 2013-11-02 DIAGNOSIS — J45901 Unspecified asthma with (acute) exacerbation: Secondary | ICD-10-CM

## 2013-11-02 MED ORDER — PREDNISONE 10 MG PO TABS: ORAL_TABLET | ORAL | Status: DC

## 2013-11-02 NOTE — Assessment & Plan Note (Signed)
The patient's history is most consistent with a mild acute asthma exacerbation, probably related to a viral sinobronchitis. His mucus is clearing, and therefore I would like to hold off on antibiotics. However, I think he will benefit from a course of prednisone to help with airway inflammation and return him back to his usual baseline.

## 2013-11-02 NOTE — Patient Instructions (Signed)
Will treat with a course of prednisone.  If you are not returning to baseline, let us know, and will consider treating with an antibiotic as well. No change in meds. Cancel upcoming apptm next month, and let me see you back in 54mos with spirometry the same day.

## 2013-11-02 NOTE — Progress Notes (Signed)
   Subjective:    Patient ID: [name] [name], male    DOB: 1945/11/02, 68 y.o.   MRN: [id number]  HPI The patient comes in today for an acute sick visit. He recently returned from [place], and started getting ill at the very end of his trip. He has noted increasing chest congestion, cough with mucus, as well as sinus congestion. His mucus was purulent, but now it is starting to clear. However, he is having decreased airflow with some increasing shortness of breath and increased rescue inhaler you use.   Review of Systems  Constitutional: Negative for fever and unexpected weight change.  HENT: Positive for congestion. Negative for dental problem, ear pain, nosebleeds, postnasal drip, rhinorrhea, sinus pressure, sneezing, sore throat and trouble swallowing.   Eyes: Negative for redness and itching.  Respiratory: Positive for cough, chest tightness, shortness of breath and wheezing.   Cardiovascular: Negative for palpitations and leg swelling.  Gastrointestinal: Negative for nausea and vomiting.  Genitourinary: Negative for dysuria.  Musculoskeletal: Negative for joint swelling.  Skin: Negative for rash.  Neurological: Negative for headaches.  Hematological: Does not bruise/bleed easily.  Psychiatric/Behavioral: Negative for dysphoric mood. The patient is not nervous/anxious.        Objective:   Physical Exam Well-developed male in no acute distress Nose without purulence or discharge noted Neck without lymphadenopathy or thyromegaly Chest with rhonchi, but no wheezes or crackles Cardiac exam with regular rate and rhythm Lower extremities without edema, no cyanosis Alert and oriented, moves all 4 extremities.       Assessment & Plan:

## 2013-11-24 ENCOUNTER — Encounter: Payer: Self-pay | Admitting: Sports Medicine

## 2013-11-24 ENCOUNTER — Ambulatory Visit (INDEPENDENT_AMBULATORY_CARE_PROVIDER_SITE_OTHER): Payer: BC Managed Care – PPO | Admitting: Sports Medicine

## 2013-11-24 VITALS — BP 150/110 | Ht 72.0 in | Wt 175.0 lb

## 2013-11-24 DIAGNOSIS — M549 Dorsalgia, unspecified: Secondary | ICD-10-CM

## 2013-11-24 MED ORDER — TRAMADOL HCL 50 MG PO TABS: 50.0000 mg | ORAL_TABLET | Freq: Three times a day (TID) | ORAL | Status: DC | PRN

## 2013-11-24 MED ORDER — PREDNISONE 10 MG PO TABS: ORAL_TABLET | ORAL | Status: DC

## 2013-11-24 MED ORDER — METHYLPREDNISOLONE ACETATE 80 MG/ML IJ SUSP
80.0000 mg | Freq: Once | INTRAMUSCULAR | Status: AC
  Administered 2013-11-24: 80 mg via INTRAMUSCULAR

## 2013-11-24 MED ORDER — METHOCARBAMOL 750 MG PO TABS: 750.0000 mg | ORAL_TABLET | Freq: Three times a day (TID) | ORAL | Status: DC | PRN

## 2013-11-24 NOTE — Progress Notes (Signed)
  [name] [name] - 68 y.o. male MRN [id number]  Date of birth: Mar 06, 1946    SUBJECTIVE:     [name] "[name] [name]" reports today for evaluation of low back pain for approximately 2 weeks.  The pain initially began 2 weeks ago after doing the "happy baby during yoga."  He describes the pain as a dull constant ache.  The pain got progressively worse as he continued to do his gym exercises involving abdominal work over the past 2 weeks and he now describes the pain as extreme soreness.  The pain has begun keeping him awake at night and he has begun taking Ambien provided by his PCP.  He has also tried some methocarbamol (his wife's) which provided some relief.  He has been working with a physical therapist at his gym on some back stretches, but is concerned about the pain persisting.  He denies any lower surety, numbness, tingling, weakness.  He denies any previous back injury.  He denies any night, weight loss, fever or chills; no history of cancer; no bowel or bladder incontinence.   ROS:     See HPI  PERTINENT  PMH / PSH FH / / SH:  Past Medical, Surgical, Social, and Family History Reviewed & Updated in the EMR.  Pertinent findings include:  None  OBJECTIVE: BP 150/110  Ht 6' (1.829 m)  Wt 175 lb (79.379 kg)  BMI 23.73 kg/m2  Physical Exam:  Vital signs are reviewed.  Back Exam: Inspection: Normal alignment Palpation: Mild paraspinal tenderness and paraspinal muscle tension noted; no spinal process tenderness Range of motion: Full SLR seated & lying as well as XSLR seated & lying: Negative FABER: Negative Sensory: Intact Patellar and Achilles reflexes 2+ Strength at foot Plantar-flexion: 5/5 for knee extension and flexion; ankle plantar flexion, and dorsiflexion  Able to toe walk and heel walk  ASSESSMENT & PLAN:  See problem based charting & AVS for pt instructions.

## 2013-11-24 NOTE — Assessment & Plan Note (Addendum)
Low back pain only when lying flat; pain affecting his sleep - No current concerns for neurovascular compromise - Depo-Medrol 80 mg IM today in clinic - Advised against continued ibuprofen given current symptoms of gastritis - Prescriptions for Ultram and Methocarbamol provided - Given prescription for steroid Dosepak, which can be started if pain not improved with today's steroid injection as he is going on a cruise to [place] this Saturday for 10 days - Will reevaluate in 2-3 weeks.  Consider imaging if pain not improved at that time

## 2013-12-06 ENCOUNTER — Other Ambulatory Visit: Payer: Self-pay | Admitting: *Deleted

## 2013-12-06 ENCOUNTER — Ambulatory Visit
Admission: RE | Admit: 2013-12-06 | Discharge: 2013-12-06 | Disposition: A | Discharge status: Home / Self Care | Payer: BC Managed Care – PPO | Source: Ambulatory Visit | Attending: Sports Medicine | Admitting: Sports Medicine

## 2013-12-06 ENCOUNTER — Other Ambulatory Visit: Payer: Self-pay | Admitting: Sports Medicine

## 2013-12-06 DIAGNOSIS — M545 Low back pain: Secondary | ICD-10-CM

## 2013-12-06 MED ORDER — HYDROCODONE-ACETAMINOPHEN 5-325 MG PO TABS: 1.0000 | ORAL_TABLET | Freq: Four times a day (QID) | ORAL | Status: DC | PRN

## 2013-12-07 ENCOUNTER — Telehealth: Payer: Self-pay | Admitting: Sports Medicine

## 2013-12-07 NOTE — Telephone Encounter (Signed)
I spoke with the patient on the phone today after he called yesterday complaining of persistent low back pain. I ordered a two-view lumbar spine which shows degenerative disc disease and a degenerative listhesis at L3-L4 as well as diffuse facet arthropathy. Nothing acute. No obvious bony lesions. He continues to complain of low back pain without radiculopathy. I've given him a prescription for a few hydrocodone to take as needed for pain and if his symptoms persist for another 2-3 days he is instructed to contact me again at which point I would consider merits of further diagnostic imaging in anticipation of possible referral for lumbar ESI's or facet injections.

## 2013-12-08 ENCOUNTER — Ambulatory Visit
Admission: RE | Admit: 2013-12-08 | Discharge: 2013-12-08 | Disposition: A | Discharge status: Home / Self Care | Payer: BC Managed Care – PPO | Source: Ambulatory Visit | Attending: Neurosurgery | Admitting: Neurosurgery

## 2013-12-08 ENCOUNTER — Other Ambulatory Visit: Payer: Self-pay | Admitting: Neurosurgery

## 2013-12-08 DIAGNOSIS — M5126 Other intervertebral disc displacement, lumbar region: Secondary | ICD-10-CM

## 2013-12-15 ENCOUNTER — Emergency Department (HOSPITAL_COMMUNITY): Payer: BC Managed Care – PPO

## 2013-12-15 ENCOUNTER — Inpatient Hospital Stay (HOSPITAL_COMMUNITY)
Admission: EM | Admit: 2013-12-15 | Discharge: 2013-12-25 | DRG: 180 | Disposition: A | Discharge status: Home / Self Care | Payer: BC Managed Care – PPO | Attending: Internal Medicine | Admitting: Internal Medicine

## 2013-12-15 ENCOUNTER — Encounter (HOSPITAL_COMMUNITY): Payer: Self-pay | Admitting: Emergency Medicine

## 2013-12-15 DIAGNOSIS — F10239 Alcohol dependence with withdrawal, unspecified: Secondary | ICD-10-CM | POA: Diagnosis present

## 2013-12-15 DIAGNOSIS — R7401 Elevation of levels of liver transaminase levels: Secondary | ICD-10-CM | POA: Diagnosis present

## 2013-12-15 DIAGNOSIS — R627 Adult failure to thrive: Secondary | ICD-10-CM | POA: Diagnosis present

## 2013-12-15 DIAGNOSIS — E236 Other disorders of pituitary gland: Secondary | ICD-10-CM | POA: Diagnosis present

## 2013-12-15 DIAGNOSIS — C781 Secondary malignant neoplasm of mediastinum: Secondary | ICD-10-CM | POA: Diagnosis present

## 2013-12-15 DIAGNOSIS — R160 Hepatomegaly, not elsewhere classified: Secondary | ICD-10-CM

## 2013-12-15 DIAGNOSIS — D65 Disseminated intravascular coagulation [defibrination syndrome]: Secondary | ICD-10-CM | POA: Diagnosis present

## 2013-12-15 DIAGNOSIS — G9389 Other specified disorders of brain: Secondary | ICD-10-CM | POA: Diagnosis present

## 2013-12-15 DIAGNOSIS — IMO0002 Reserved for concepts with insufficient information to code with codable children: Secondary | ICD-10-CM

## 2013-12-15 DIAGNOSIS — Z79899 Other long term (current) drug therapy: Secondary | ICD-10-CM

## 2013-12-15 DIAGNOSIS — Z87891 Personal history of nicotine dependence: Secondary | ICD-10-CM

## 2013-12-15 DIAGNOSIS — J4530 Mild persistent asthma, uncomplicated: Secondary | ICD-10-CM

## 2013-12-15 DIAGNOSIS — G9341 Metabolic encephalopathy: Secondary | ICD-10-CM | POA: Diagnosis present

## 2013-12-15 DIAGNOSIS — C7931 Secondary malignant neoplasm of brain: Secondary | ICD-10-CM | POA: Diagnosis present

## 2013-12-15 DIAGNOSIS — J45909 Unspecified asthma, uncomplicated: Secondary | ICD-10-CM | POA: Diagnosis present

## 2013-12-15 DIAGNOSIS — E785 Hyperlipidemia, unspecified: Secondary | ICD-10-CM | POA: Diagnosis present

## 2013-12-15 DIAGNOSIS — E43 Unspecified severe protein-calorie malnutrition: Secondary | ICD-10-CM

## 2013-12-15 DIAGNOSIS — R531 Weakness: Secondary | ICD-10-CM

## 2013-12-15 DIAGNOSIS — M545 Low back pain, unspecified: Secondary | ICD-10-CM | POA: Diagnosis present

## 2013-12-15 DIAGNOSIS — R599 Enlarged lymph nodes, unspecified: Secondary | ICD-10-CM | POA: Diagnosis present

## 2013-12-15 DIAGNOSIS — R590 Localized enlarged lymph nodes: Secondary | ICD-10-CM

## 2013-12-15 DIAGNOSIS — M47817 Spondylosis without myelopathy or radiculopathy, lumbosacral region: Secondary | ICD-10-CM | POA: Diagnosis present

## 2013-12-15 DIAGNOSIS — J189 Pneumonia, unspecified organism: Secondary | ICD-10-CM

## 2013-12-15 DIAGNOSIS — R634 Abnormal weight loss: Secondary | ICD-10-CM

## 2013-12-15 DIAGNOSIS — F10939 Alcohol use, unspecified with withdrawal, unspecified: Secondary | ICD-10-CM | POA: Diagnosis present

## 2013-12-15 DIAGNOSIS — R0602 Shortness of breath: Secondary | ICD-10-CM | POA: Diagnosis present

## 2013-12-15 DIAGNOSIS — R7402 Elevation of levels of lactic acid dehydrogenase (LDH): Secondary | ICD-10-CM | POA: Diagnosis present

## 2013-12-15 DIAGNOSIS — Z860100 Personal history of colon polyps, unspecified: Secondary | ICD-10-CM

## 2013-12-15 DIAGNOSIS — K219 Gastro-esophageal reflux disease without esophagitis: Secondary | ICD-10-CM | POA: Diagnosis present

## 2013-12-15 DIAGNOSIS — K5900 Constipation, unspecified: Secondary | ICD-10-CM | POA: Diagnosis present

## 2013-12-15 DIAGNOSIS — E871 Hypo-osmolality and hyponatremia: Secondary | ICD-10-CM

## 2013-12-15 DIAGNOSIS — D649 Anemia, unspecified: Secondary | ICD-10-CM

## 2013-12-15 DIAGNOSIS — R740 Nonspecific elevation of levels of transaminase and lactic acid dehydrogenase [LDH]: Secondary | ICD-10-CM

## 2013-12-15 DIAGNOSIS — C787 Secondary malignant neoplasm of liver and intrahepatic bile duct: Secondary | ICD-10-CM | POA: Diagnosis present

## 2013-12-15 DIAGNOSIS — F10931 Alcohol use, unspecified with withdrawal delirium: Secondary | ICD-10-CM

## 2013-12-15 DIAGNOSIS — D630 Anemia in neoplastic disease: Secondary | ICD-10-CM | POA: Diagnosis present

## 2013-12-15 DIAGNOSIS — M549 Dorsalgia, unspecified: Secondary | ICD-10-CM

## 2013-12-15 DIAGNOSIS — F1020 Alcohol dependence, uncomplicated: Secondary | ICD-10-CM | POA: Diagnosis present

## 2013-12-15 DIAGNOSIS — F10231 Alcohol dependence with withdrawal delirium: Secondary | ICD-10-CM

## 2013-12-15 DIAGNOSIS — Z7982 Long term (current) use of aspirin: Secondary | ICD-10-CM | POA: Diagnosis not present

## 2013-12-15 DIAGNOSIS — C7949 Secondary malignant neoplasm of other parts of nervous system: Secondary | ICD-10-CM | POA: Diagnosis present

## 2013-12-15 DIAGNOSIS — C3430 Malignant neoplasm of lower lobe, unspecified bronchus or lung: Secondary | ICD-10-CM | POA: Diagnosis present

## 2013-12-15 DIAGNOSIS — G9340 Encephalopathy, unspecified: Secondary | ICD-10-CM

## 2013-12-15 DIAGNOSIS — C3492 Malignant neoplasm of unspecified part of left bronchus or lung: Secondary | ICD-10-CM

## 2013-12-15 DIAGNOSIS — R918 Other nonspecific abnormal finding of lung field: Secondary | ICD-10-CM | POA: Diagnosis present

## 2013-12-15 DIAGNOSIS — J4540 Moderate persistent asthma, uncomplicated: Secondary | ICD-10-CM

## 2013-12-15 DIAGNOSIS — C3490 Malignant neoplasm of unspecified part of unspecified bronchus or lung: Secondary | ICD-10-CM

## 2013-12-15 DIAGNOSIS — C3491 Malignant neoplasm of unspecified part of right bronchus or lung: Secondary | ICD-10-CM

## 2013-12-15 HISTORY — DX: Malignant neoplasm of unspecified part of unspecified bronchus or lung: C34.90

## 2013-12-15 LAB — CBC
HEMATOCRIT: 37.2 % — AB (ref 39.0–52.0)
HEMOGLOBIN: 13.8 g/dL (ref 13.0–17.0)
MCH: 33.8 pg (ref 26.0–34.0)
MCHC: 36.9 g/dL — AB (ref 30.0–36.0)
MCV: 91.2 fL (ref 78.0–100.0)
Platelets: 183 10*3/uL (ref 150–400)
RBC: 4.08 MIL/uL — ABNORMAL LOW (ref 4.22–5.81)
RDW: 13 % (ref 11.5–15.5)
WBC: 5.9 10*3/uL (ref 4.0–10.5)

## 2013-12-15 LAB — I-STAT TROPONIN, ED: Troponin i, poc: 0 ng/mL (ref 0.00–0.08)

## 2013-12-15 LAB — URINALYSIS, ROUTINE W REFLEX MICROSCOPIC
Glucose, UA: NEGATIVE mg/dL
Hgb urine dipstick: NEGATIVE
Ketones, ur: 15 mg/dL — AB
LEUKOCYTES UA: NEGATIVE
Nitrite: NEGATIVE
PH: 6 (ref 5.0–8.0)
Protein, ur: NEGATIVE mg/dL
Specific Gravity, Urine: 1.005 (ref 1.005–1.030)
Urobilinogen, UA: 1 mg/dL (ref 0.0–1.0)

## 2013-12-15 LAB — CBG MONITORING, ED: Glucose-Capillary: 127 mg/dL — ABNORMAL HIGH (ref 70–99)

## 2013-12-15 LAB — BASIC METABOLIC PANEL
Anion gap: 18 — ABNORMAL HIGH (ref 5–15)
BUN: 21 mg/dL (ref 6–23)
CO2: 20 mEq/L (ref 19–32)
Calcium: 9.3 mg/dL (ref 8.4–10.5)
Chloride: 83 mEq/L — ABNORMAL LOW (ref 96–112)
Creatinine, Ser: 0.6 mg/dL (ref 0.50–1.35)
GFR calc Af Amer: >90 mL/min (ref >90)
GLUCOSE: 117 mg/dL — AB (ref 70–99)
Potassium: 4 mEq/L (ref 3.7–5.3)
SODIUM: 121 meq/L — AB (ref 137–147)

## 2013-12-15 LAB — D-DIMER, QUANTITATIVE: D-Dimer, Quant: 3.19 ug/mL-FEU — ABNORMAL HIGH (ref 0.00–0.48)

## 2013-12-15 LAB — PRO B NATRIURETIC PEPTIDE: Pro B Natriuretic peptide (BNP): 192 pg/mL — ABNORMAL HIGH (ref 0–125)

## 2013-12-15 MED ORDER — LORAZEPAM 1 MG PO TABS: 1.0000 mg | ORAL_TABLET | Freq: Four times a day (QID) | ORAL | Status: DC | PRN

## 2013-12-15 MED ORDER — DEXTROSE 5 % IV SOLN
500.0000 mg | Freq: Once | INTRAVENOUS | Status: AC
  Administered 2013-12-15: 500 mg via INTRAVENOUS
  Filled 2013-12-15: qty 500

## 2013-12-15 MED ORDER — DEXTROSE 5 % IV SOLN
1.0000 g | INTRAVENOUS | Status: DC
  Administered 2013-12-16 – 2013-12-17 (×2): 1 g via INTRAVENOUS
  Filled 2013-12-15 (×2): qty 10

## 2013-12-15 MED ORDER — IOHEXOL 350 MG/ML SOLN
100.0000 mL | Freq: Once | INTRAVENOUS | Status: AC | PRN
  Administered 2013-12-15: 100 mL via INTRAVENOUS

## 2013-12-15 MED ORDER — FOLIC ACID 1 MG PO TABS
1.0000 mg | ORAL_TABLET | Freq: Every day | ORAL | Status: DC
  Administered 2013-12-16 – 2013-12-17 (×2): 1 mg via ORAL
  Filled 2013-12-15 (×2): qty 1

## 2013-12-15 MED ORDER — ALBUTEROL SULFATE (2.5 MG/3ML) 0.083% IN NEBU
2.5000 mg | INHALATION_SOLUTION | RESPIRATORY_TRACT | Status: DC | PRN
  Administered 2013-12-16: 2.5 mg via RESPIRATORY_TRACT
  Filled 2013-12-15 (×2): qty 3

## 2013-12-15 MED ORDER — ADULT MULTIVITAMIN W/MINERALS CH
1.0000 | ORAL_TABLET | Freq: Every day | ORAL | Status: DC
  Administered 2013-12-16 – 2013-12-17 (×2): 1 via ORAL
  Filled 2013-12-15 (×2): qty 1

## 2013-12-15 MED ORDER — LORAZEPAM 2 MG/ML IJ SOLN: 1.0000 mg | Freq: Four times a day (QID) | INTRAMUSCULAR | Status: DC | PRN

## 2013-12-15 MED ORDER — SODIUM CHLORIDE 0.9 % IV BOLUS (SEPSIS)
500.0000 mL | Freq: Once | INTRAVENOUS | Status: AC
  Administered 2013-12-15: 500 mL via INTRAVENOUS

## 2013-12-15 MED ORDER — LORAZEPAM 2 MG/ML IJ SOLN: 0.0000 mg | Freq: Two times a day (BID) | INTRAMUSCULAR | Status: DC

## 2013-12-15 MED ORDER — DEXTROSE 5 % IV SOLN
500.0000 mg | INTRAVENOUS | Status: DC
  Administered 2013-12-16 – 2013-12-17 (×2): 500 mg via INTRAVENOUS
  Filled 2013-12-15 (×2): qty 500

## 2013-12-15 MED ORDER — LORAZEPAM 2 MG/ML IJ SOLN
0.0000 mg | Freq: Four times a day (QID) | INTRAMUSCULAR | Status: DC
  Administered 2013-12-16 – 2013-12-17 (×5): 2 mg via INTRAVENOUS
  Filled 2013-12-15 (×5): qty 1

## 2013-12-15 MED ORDER — SODIUM CHLORIDE 0.9 % IV SOLN
INTRAVENOUS | Status: DC
  Administered 2013-12-15: 19:00:00 via INTRAVENOUS

## 2013-12-15 MED ORDER — ALBUTEROL SULFATE (2.5 MG/3ML) 0.083% IN NEBU
2.5000 mg | INHALATION_SOLUTION | Freq: Four times a day (QID) | RESPIRATORY_TRACT | Status: DC
  Administered 2013-12-16 (×3): 2.5 mg via RESPIRATORY_TRACT
  Filled 2013-12-15 (×3): qty 3

## 2013-12-15 MED ORDER — DEXTROSE 5 % IV SOLN
1.0000 g | INTRAVENOUS | Status: DC
  Administered 2013-12-15: 1 g via INTRAVENOUS
  Filled 2013-12-15: qty 10

## 2013-12-15 MED ORDER — VITAMIN B-1 100 MG PO TABS
100.0000 mg | ORAL_TABLET | Freq: Every day | ORAL | Status: DC
  Administered 2013-12-16 – 2013-12-25 (×9): 100 mg via ORAL
  Filled 2013-12-15 (×10): qty 1

## 2013-12-15 MED ORDER — THIAMINE HCL 100 MG/ML IJ SOLN
100.0000 mg | Freq: Every day | INTRAMUSCULAR | Status: DC
  Filled 2013-12-15 (×8): qty 1

## 2013-12-15 NOTE — ED Notes (Signed)
Report attempted x 1

## 2013-12-15 NOTE — ED Notes (Signed)
Pt undressed, in gown, on monitor, continuous pulse oximetry and blood pressure cuff; family at bedside 

## 2013-12-15 NOTE — ED Notes (Addendum)
Pt presents to ED via EMS from [facility] (Dr. Laurann Montana pt, seen by [facility] today), in for abnormal ECG-T-wave inverted. Also c/o weakness, SOB, and decreased appetite x2 months. Per EMS, pt positive for orthostatic hypotension (BP-136/92 sitting and BP-82/61 standing). Pt also reports that he lost about 15 pounds in the last 2 months. Per EMS, SpO2-95% on room air.

## 2013-12-15 NOTE — H&P (Signed)
[facility] Admission History and Physical       Telford Archambeau jr GMW:[id number] DOB: 26-Jul-1945 DOA: 12/15/2013  Referring physician:  PCP: [name], MD  Specialists:   Chief Complaint: SOB and Progressive Weakness  HPI: [name] [name] is a 68 y.o. male with a history of Asthma, Hyperlipidemia, and GERD who presents to the ED with complaints of increased SOB and progressive weakness over the pat 2 weeks.   He reports that he has had a loss of appetite over the past 2 months and has lost 15 pounds.  He denies any fevers or chills or cough.   He has also had low back pain for the past  Month, and denies any history of trauma.  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   He was given Muscle relaxant Rx and Hydrocodone for his pain.   He was seen by his PCP today and had an EKG and was sent to the ED for a further workup.  A d-Dimer was performed and thus a CTA of the Chest was performed to rule out a Pulmonary Embolism and the findings were Negative for a PE but revealed a 3 cm LLL mass, mediastinal and perirenal adenopathy, numerous Liver lesions, and Airspace Disease of the RLL.   He was placed on IV Rocephin and Azithromycin and  Referred for medical admission.         Review of Systems:  Constitutional: No Weight Loss, No Weight Gain, Night Sweats, Fevers, Chills, Dizziness, Fatigue, or Generalized Weakness HEENT: No Headaches, Difficulty Swallowing,Tooth/Dental Problems,Sore Throat,  No Sneezing, Rhinitis, Ear Ache, Nasal Congestion, or Post Nasal Drip,  Cardio-vascular:  No Chest pain, Orthopnea, PND, Edema in Lower Extremities, Anasarca, Dizziness, Palpitations  Resp: No Dyspnea, No DOE, No Productive Cough, No Non-Productive Cough, No Hemoptysis, No Wheezing.    GI: No Heartburn, Indigestion, Abdominal Pain, Nausea, Vomiting, Diarrhea, Hematemesis, Hematochezia, Melena, Change in  Bowel Habits,  Loss of Appetite  GU: No Dysuria, Change in Color of Urine, No Urgency or Frequency, No Flank pain.  Musculoskeletal: No Joint Pain or Swelling, No Decreased Range of Motion, No Back Pain.  Neurologic: No Syncope, No Seizures, Muscle Weakness, Paresthesia, Vision Disturbance or Loss, No Diplopia, No Vertigo, No Difficulty Walking,  Skin: No Rash or Lesions. Psych: No Change in Mood or Affect, No Depression or Anxiety, No Memory loss, No Confusion, or Hallucinations   Past Medical History  Diagnosis Date  . Asthma   . Other and unspecified hyperlipidemia   . GERD (gastroesophageal reflux disease)   . Hypercalcemia   . Skin cancer   . Colitis 2012    Colonoscopy  . External hemorrhoids without mention of complication 4403    Colonoscopy   . Diverticulosis of colon (without mention of hemorrhage) 2008    Colonoscopy  . History of colon polyps 2008    Colonoscopy  . Hiatal hernia 2008    EGD      Past Surgical History  Procedure Laterality Date  . Rectal surgery  2004    fistula repair  . Vasectomy    . Inguinal hernia repair        Prior to Admission medications   Medication Sig Start Date End Date Taking? Authorizing Provider  albuterol (PROVENTIL HFA;VENTOLIN HFA) 108 (90 BASE) MCG/ACT inhaler Inhale 2 puffs into the lungs every 6 (six) hours as needed for wheezing or shortness  of breath.   Yes Historical Provider, MD  aspirin 81 MG tablet Take 81 mg by mouth daily.     Yes Historical Provider, MD  budesonide-formoterol (SYMBICORT) 160-4.5 MCG/ACT inhaler Inhale 2 puffs into the lungs 2 (two) times daily.   Yes Historical Provider, MD  cetirizine (ZYRTEC) 10 MG tablet Take 10 mg by mouth daily.     Yes Historical Provider, MD  Cholecalciferol (VITAMIN D-3 PO) Take 1 capsule by mouth daily.   Yes Historical Provider, MD  diphenoxylate-atropine (LOMOTIL) 2.5-0.025 MG per tablet Take 1 tablet by mouth 4 (four) times daily as needed for diarrhea or loose stools.    Yes Historical Provider, MD  fluticasone (FLONASE) 50 MCG/ACT nasal spray Place 2 sprays into both nostrils daily as needed for allergies or rhinitis.   Yes Historical Provider, MD  guaiFENesin (MUCINEX) 600 MG 12 hr tablet Take 1,200 mg by mouth 2 (two) times daily as needed.    Yes Historical Provider, MD  HYDROcodone-acetaminophen (NORCO/VICODIN) 5-325 MG per tablet Take 1 tablet by mouth every 6 (six) hours as needed for moderate pain.   Yes Historical Provider, MD  mesalamine (CANASA) 1000 MG suppository Place 1,000 mg rectally at bedtime.   Yes Historical Provider, MD  methocarbamol (ROBAXIN) 750 MG tablet Take 1 tablet (750 mg total) by mouth every 8 (eight) hours as needed for muscle spasms. 11/24/13  Yes Olam Idler, MD  montelukast (SINGULAIR) 10 MG tablet Take 10 mg by mouth at bedtime.   Yes Historical Provider, MD  RABEprazole (ACIPHEX) 20 MG tablet Take 20 mg by mouth daily.   Yes Historical Provider, MD  traMADol (ULTRAM) 50 MG tablet Take 50 mg by mouth every 8 (eight) hours as needed for moderate pain.   Yes Historical Provider, MD  zolpidem (AMBIEN) 5 MG tablet Take 5 mg by mouth at bedtime.  11/23/13  Yes Historical Provider, MD  predniSONE (DELTASONE) 10 MG tablet Take 6 tabs daily x 1 day; 5 tabs daily x 1 day, 4 tabs x 1 day, 3 tabs daily x 1 day, 2 tab x 1 day, then 1 tab x 1 day, then stop 11/24/13   Olam Idler, MD     No Known Allergies   Social History:  reports that he quit smoking about 23 years ago. His smoking use included Cigarettes. He has a 37.5 pack-year smoking history. He has never used smokeless tobacco. He reports that he drinks about 3 ounces of alcohol per week. He reports that he does not use illicit drugs.     Family History  Problem Relation Age of Onset  . Heart disease Father   . Alzheimer's disease Mother       Physical Exam:  GEN:  Pleasant Elderly Ill appearing  68 y.o. Caucasian male examined and in no acute distress; cooperative with  exam Filed Vitals:   12/15/13 2118 12/15/13 2130 12/15/13 2237 12/15/13 2300  BP: 153/98 161/98 157/95 136/94  Pulse: 87 82 88 93  Temp:      TempSrc:      Resp: 16 15 17 12   SpO2: 94% 95% 96% 97%   Blood pressure 136/94, pulse 93, temperature 98.7 F (37.1 C), temperature source Oral, resp. rate 12, SpO2 97.00%. PSYCH: He is alert and oriented x4; does not appear anxious does not appear depressed; affect is normal HEENT: Normocephalic and Atraumatic, Mucous membranes pink; PERRLA; EOM intact; Fundi:  Benign;  No scleral icterus, Nares: Patent, Oropharynx: Clear, Fair Dentition,  Neck:  FROM, No Cervical Lymphadenopathy nor Thyromegaly or Carotid Bruit; No JVD; Breasts:: Not examined CHEST WALL: No tenderness CHEST: Normal respiration, Occasional rhonchi, NO wheezes, No rales.   HEART: Regular rate and rhythm; no murmurs rubs or gallops BACK: No kyphosis or scoliosis; No CVA tenderness ABDOMEN: Positive Bowel Sounds, Soft Non-Tender; No Masses, No Organomegaly. Rectal Exam: Not done EXTREMITIES: No Cyanosis, Clubbing, or Edema; No Ulcerations. Genitalia: not examined PULSES: 2+ and symmetric SKIN: Normal hydration no rash or ulceration CNS: Alert and Oriented x 4, No Focal Deficits Vascular: pulses palpable throughout    Labs on Admission:  Basic Metabolic Panel:  Recent Labs Lab 12/15/13 1750  NA 121*  K 4.0  CL 83*  CO2 20  GLUCOSE 117*  BUN 21  CREATININE 0.60  CALCIUM 9.3   Liver Function Tests: No results found for this basename: AST, ALT, ALKPHOS, BILITOT, PROT, ALBUMIN,  in the last 168 hours No results found for this basename: LIPASE, AMYLASE,  in the last 168 hours No results found for this basename: AMMONIA,  in the last 168 hours CBC:  Recent Labs Lab 12/15/13 1750  WBC 5.9  HGB 13.8  HCT 37.2*  MCV 91.2  PLT 183   Cardiac Enzymes: No results found for this basename: CKTOTAL, CKMB, CKMBINDEX, TROPONINI,  in the last 168 hours  BNP (last 3  results)  Recent Labs  12/15/13 1750  PROBNP 192.0*   CBG:  Recent Labs Lab 12/15/13 1830  GLUCAP 127*    Radiological Exams on Admission: Dg Chest 2 View  12/15/2013   CLINICAL DATA:  Short of breath and weakness  EXAM: CHEST  2 VIEW  COMPARISON:  Chest 06/07/2009.  FINDINGS: Heart size and vascularity are normal. Negative for pneumonia or effusion.  Right perihilar linear density most likely scarring but not definitely seen previously.  Chronic right rib fracture.  IMPRESSION: Right perihilar density has the appearance of scarring. Otherwise no acute abnormality.   Electronically Signed   By: Franchot Gallo M.D.   On: 12/15/2013 19:14   Ct Angio Chest Pe W/cm &/or Wo Cm  12/15/2013   CLINICAL DATA:  Elevated D-dimer. Shortness of breath. Recent travel.  EXAM: CT ANGIOGRAPHY CHEST WITH CONTRAST  TECHNIQUE: Multidetector CT imaging of the chest was performed using the standard protocol during bolus administration of intravenous contrast. Multiplanar CT image reconstructions and MIPs were obtained to evaluate the vascular anatomy.  CONTRAST:  165mL OMNIPAQUE IOHEXOL 350 MG/ML SOLN  COMPARISON:  01/22/2006  FINDINGS: THORACIC INLET/BODY WALL:  Incidentally and partially visualized larynx notable for enlargement of the left laryngeal ventricle and medialization of the arytenoid.  MEDIASTINUM:  Normal heart size. No pericardial effusion. Extensive coronary atherosclerosis. No acute vascular abnormality, including pulmonary embolism or aortic dissection.  Bulky lymphadenopathy throughout the mediastinum. A conglomerate of left lower peritracheal nodes measures 2.3 cm in short axis. A discrete subcarinal node measures 2.3 cm in short axis. The nodes are relatively hypoenhancing. Upper peritracheal nodes present bilaterally. Possible early spread to the right supraclavicular station which will be better evaluated by PET.  LUNG WINDOWS:  Consolidative opacities in the medial right lower lobe. Opacity in  the medial left lower lobe, measuring 3 cm in diameter, has a lobulated appearance especially on reformatted imaging. Peribronchovascular nodule in the left lower lobe measuring 19 mm in maximal diameter. This nodule compresses the segmental bronchi. Diffuse bronchial wall thickening, with mucoid impaction in the right lower lobe.  UPPER ABDOMEN:  New lobulated liver  surface with numerous ill-defined hyperdense nodules. There are at least 2 perirenal nodules around the left upper pole, measuring up to 12 mm. The adrenal glands appear thickened and mildly nodular, without measurable nodule.  OSSEOUS:  No acute fracture.  No suspicious lytic or blastic lesions.  Review of the MIP images confirms the above findings.  IMPRESSION: 1. Negative for pulmonary embolism. 2. 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 Specifically, there is a 3 cm mass like opacity in left lower lobe, bilateral mediastinal adenopathy, numerous liver masses, left perirenal nodules, and possible early adrenal involvement. 3. Airspace disease in the right lower lobe, likely pneumonia. 4. Findings of left vocal cord paralysis.  Correlate with endoscopy.   Electronically Signed   By: Jorje Guild M.D.   On: 12/15/2013 21:05     EKG: Independently reviewed.    Assessment/Plan:   68 y.o. male with  Principal Problem:   1.   Lung mass/ Mediastinal adenopathy   Suspect Lung Cancer with Metastatic Disease per the CTA of the Chest findings   Pulmonary Consulted for further evaluation and for PET scan and Tissue Diagnosis         2.   CAP (community acquired pneumonia)   IV Rocephin   IV Azithromycin   Albuterol Nebs   O2  PRN   Monitor O2 Sats     3.   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   Low back pain- Due to Musculoskeletal pain versus Metastatic Disease   Pet Scan should  help delineate   Pain Control PRN     5.   Weight loss-  Due to Cachexia from  #1.      Nutrition evaluation and supplementation        6.   HYPERLIPIDEMIA   Check Lipids    7.   Intrinsic asthma   Stable     8.   GERD   Protonix      9.   DVT Prophylaxis   SCDs        Code Status:   FULL CODE    Family Communication:   Daughter at Bedside    Disposition Plan:    Inpatient  Time spent:  [facility] Pager [phone]   If [place] Please Contact the Day Rounding Team MD for [facility]  If 7PM-7AM, Please Contact night-coverage  [email or web address] Password [email or web address] 12/15/2013, 11:59 PM

## 2013-12-15 NOTE — Consult Note (Signed)
Name: [name] [name] MRN: [id number] DOB: August 30, 1945    ADMISSION DATE:  12/15/2013 CONSULTATION DATE:  12/15/2013  REFERRING MD :  EDP  CHIEF COMPLAINT:  Weakness.   BRIEF PATIENT DESCRIPTION:   68 yo male former smoker and hx of ETOH was sent to ED from PCP office to evaluate weakness, dyspnea, weight loss and abnormal ECG.  He had CT chest which was concerning for metastatic lung cancer.  He is followed by Dr. Gwenette Greet in pulmonary office for asthma.  SIGNIFICANT EVENTS: 8/05 Admit  STUDIES:  8/5 CTA chest >> extensive CAD, 2.3 cm paratracheal LAN, 2.3 cm subcarinal LAN, possible Rt supraclavicular LAN, RLL consolidation, 3 cm density LLL, 1.9 cm nodule LLL, mucoid impaction RLL, numerous nodules in liver, ?Lt VC paralysis  HISTORY OF PRESENT ILLNESS:   68 yo male with hx of smoking and ETOH has progressive weakness, dyspnea, weight loss and back pain.  He had CT chest in ER which showed multiple lesions concerning for lung cancer.  He was also found to have hyponatremia.  He has been started on Abx of CAP.  He has also been started on CIWA protocol.    He has chronic hoarseness.  Denies headache or change in vision.  Denies hemoptysis, fever, sweats, chest pain, abdominal pain, or leg swelling.  Denies recent smoking history.   He  has a past medical history of Asthma; Other and unspecified hyperlipidemia; GERD (gastroesophageal reflux disease); Hypercalcemia; Skin cancer; Colitis (2012); External hemorrhoids without mention of complication (6967); Diverticulosis of colon (without mention of hemorrhage) (2008); History of colon polyps (2008); and Hiatal hernia (2008).  He  has past surgical history that includes Rectal surgery (2004); Vasectomy; and Inguinal hernia repair.   Prior to Admission medications   Medication Sig Start Date End Date Taking? Authorizing Provider  albuterol (PROVENTIL HFA;VENTOLIN HFA) 108 (90 BASE) MCG/ACT inhaler Inhale 2 puffs into the lungs every 6  (six) hours as needed for wheezing or shortness of breath.   Yes Historical Provider, MD  aspirin 81 MG tablet Take 81 mg by mouth daily.     Yes Historical Provider, MD  budesonide-formoterol (SYMBICORT) 160-4.5 MCG/ACT inhaler Inhale 2 puffs into the lungs 2 (two) times daily.   Yes Historical Provider, MD  cetirizine (ZYRTEC) 10 MG tablet Take 10 mg by mouth daily.     Yes Historical Provider, MD  Cholecalciferol (VITAMIN D-3 PO) Take 1 capsule by mouth daily.   Yes Historical Provider, MD  diphenoxylate-atropine (LOMOTIL) 2.5-0.025 MG per tablet Take 1 tablet by mouth 4 (four) times daily as needed for diarrhea or loose stools.   Yes Historical Provider, MD  fluticasone (FLONASE) 50 MCG/ACT nasal spray Place 2 sprays into both nostrils daily as needed for allergies or rhinitis.   Yes Historical Provider, MD  guaiFENesin (MUCINEX) 600 MG 12 hr tablet Take 1,200 mg by mouth 2 (two) times daily as needed.    Yes Historical Provider, MD  HYDROcodone-acetaminophen (NORCO/VICODIN) 5-325 MG per tablet Take 1 tablet by mouth every 6 (six) hours as needed for moderate pain.   Yes Historical Provider, MD  mesalamine (CANASA) 1000 MG suppository Place 1,000 mg rectally at bedtime.   Yes Historical Provider, MD  methocarbamol (ROBAXIN) 750 MG tablet Take 1 tablet (750 mg total) by mouth every 8 (eight) hours as needed for muscle spasms. 11/24/13  Yes Olam Idler, MD  montelukast (SINGULAIR) 10 MG tablet Take 10 mg by mouth at bedtime.   Yes Historical Provider, MD  RABEprazole (ACIPHEX) 20 MG tablet Take 20 mg by mouth daily.   Yes Historical Provider, MD  traMADol (ULTRAM) 50 MG tablet Take 50 mg by mouth every 8 (eight) hours as needed for moderate pain.   Yes Historical Provider, MD  zolpidem (AMBIEN) 5 MG tablet Take 5 mg by mouth at bedtime.  11/23/13  Yes Historical Provider, MD  predniSONE (DELTASONE) 10 MG tablet Take 6 tabs daily x 1 day; 5 tabs daily x 1 day, 4 tabs x 1 day, 3 tabs daily x 1 day, 2  tab x 1 day, then 1 tab x 1 day, then stop 11/24/13   Olam Idler, MD   No Known Allergies  FAMILY HISTORY:  Family History  Problem Relation Age of Onset  . Heart disease Father   . Alzheimer's disease Mother    SOCIAL HISTORY:  reports that he quit smoking about 23 years ago. His smoking use included Cigarettes. He has a 37.5 pack-year smoking history. He has never used smokeless tobacco. He reports that he drinks about 3 ounces of alcohol per week. He reports that he does not use illicit drugs.  REVIEW OF SYSTEMS: Negative except above  SUBJECTIVE:   VITAL SIGNS: Temp:  [98.7 F (37.1 C)] 98.7 F (37.1 C) (08/05 1726) Pulse Rate:  [75-93] 93 (08/05 2300) Resp:  [12-18] 12 (08/05 2300) BP: (136-161)/(90-98) 136/94 mmHg (08/05 2300) SpO2:  [94 %-100 %] 97 % (08/05 2300)  PHYSICAL EXAMINATION: General:  Male of normal body habitus in NAD Neuro: Alert, oriented HEENT:  NCAT, PERRL, no JVD Neck: No supraclavicular nodes palpated.  Cardiovascular:  RRR Lungs:  Expiratory rhonchi throughout.  Abdomen:  Soft, nontender, nondistendended Musculoskeletal:  No acute deformity or ROM limitations Skin:  Intact.    Recent Labs Lab 12/15/13 1750  NA 121*  K 4.0  CL 83*  CO2 20  BUN 21  CREATININE 0.60  GLUCOSE 117*    Recent Labs Lab 12/15/13 1750  HGB 13.8  HCT 37.2*  WBC 5.9  PLT 183   Dg Chest 2 View  12/15/2013   CLINICAL DATA:  Short of breath and weakness  EXAM: CHEST  2 VIEW  COMPARISON:  Chest 06/07/2009.  FINDINGS: Heart size and vascularity are normal. Negative for pneumonia or effusion.  Right perihilar linear density most likely scarring but not definitely seen previously.  Chronic right rib fracture.  IMPRESSION: Right perihilar density has the appearance of scarring. Otherwise no acute abnormality.   Electronically Signed   By: Franchot Gallo M.D.   On: 12/15/2013 19:14   Ct Angio Chest Pe W/cm &/or Wo Cm  12/15/2013   CLINICAL DATA:  Elevated D-dimer.  Shortness of breath. Recent travel.  EXAM: CT ANGIOGRAPHY CHEST WITH CONTRAST  TECHNIQUE: Multidetector CT imaging of the chest was performed using the standard protocol during bolus administration of intravenous contrast. Multiplanar CT image reconstructions and MIPs were obtained to evaluate the vascular anatomy.  CONTRAST:  146mL OMNIPAQUE IOHEXOL 350 MG/ML SOLN  COMPARISON:  01/22/2006  FINDINGS: THORACIC INLET/BODY WALL:  Incidentally and partially visualized larynx notable for enlargement of the left laryngeal ventricle and medialization of the arytenoid.  MEDIASTINUM:  Normal heart size. No pericardial effusion. Extensive coronary atherosclerosis. No acute vascular abnormality, including pulmonary embolism or aortic dissection.  Bulky lymphadenopathy throughout the mediastinum. A conglomerate of left lower peritracheal nodes measures 2.3 cm in short axis. A discrete subcarinal node measures 2.3 cm in short axis. The nodes are relatively hypoenhancing. Upper peritracheal  nodes present bilaterally. Possible early spread to the right supraclavicular station which will be better evaluated by PET.  LUNG WINDOWS:  Consolidative opacities in the medial right lower lobe. Opacity in the medial left lower lobe, measuring 3 cm in diameter, has a lobulated appearance especially on reformatted imaging. Peribronchovascular nodule in the left lower lobe measuring 19 mm in maximal diameter. This nodule compresses the segmental bronchi. Diffuse bronchial wall thickening, with mucoid impaction in the right lower lobe.  UPPER ABDOMEN:  New lobulated liver surface with numerous ill-defined hyperdense nodules. There are at least 2 perirenal nodules around the left upper pole, measuring up to 12 mm. The adrenal glands appear thickened and mildly nodular, without measurable nodule.  OSSEOUS:  No acute fracture.  No suspicious lytic or blastic lesions.  Review of the MIP images confirms the above findings.  IMPRESSION: 1. Negative  for pulmonary embolism. 2. 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 Specifically, there is a 3 cm mass like opacity in left lower lobe, bilateral mediastinal adenopathy, numerous liver masses, left perirenal nodules, and possible early adrenal involvement. 3. Airspace disease in the right lower lobe, likely pneumonia. 4. Findings of left vocal cord paralysis.  Correlate with endoscopy.   Electronically Signed   By: Jorje Guild M.D.   On: 12/15/2013 21:05    ASSESSMENT / PLAN:  68 yo male with hx of smoking now with weight loss, failure to thrive, hyponatremia, and findings on CT chest concerning for primary lung cancer (main concern for SCLC).  Probable lung cancer >> he will need tissue diagnosis with biopsy to further assess. Plan: Will need to arrange for PET scan to assess best approach for Bx >> this will need to be arranged for as outpt   Consolidative changes on CT chest ?PNA vs malignancy. Plan: Day 2/x rocephin, zithromax per primary team >> likely can transition to oral Abx soon  Hx of Asthma. 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 Plan: Per primary team  Alcohol use. Plan: Per primary team  Georgann Housekeeper, ACNP [facility] Pulmonology/Critical Care Pager [phone] or [phone]   Reviewed above, and examined.  Documentation changes made as needed.  He will need PET scan set up as outpt once he is medically stable for d/c home.  I have scheduled him for pulmonary follow up visit with Dr. Gwenette Greet on Thursday, August 13 at 10 am.  Please call if additional help is needed will pt is in hospital.  Chesley Mires, MD [place] 12/16/2013, 10:51 AM Pager:  [phone] After 3pm call: [phone]

## 2013-12-15 NOTE — ED Notes (Signed)
Kandee Keen, RN notified of POCT CBG results

## 2013-12-15 NOTE — ED Provider Notes (Signed)
CSN: [id number]     Arrival date & time 12/15/13  1717 History   First MD Initiated Contact with Patient 12/15/13 1759     Chief Complaint  Patient presents with  . Abnormal ECG  . Weakness     (Consider location/radiation/quality/duration/timing/severity/associated sxs/prior Treatment) The history is provided by the patient and medical records.   This is a 68 y.o. M with PMH significant for asthma, GERD, diverticulosis, presenting to the ED from PCP office for further evaluation of EKG changes, generalized weakness, and SOB.  Patient states in the past 2 weeks he is thin feeling generally weak with low energy, and decreased appetite. States he has had difficulties falling asleep as well as difficulties falling asleep.  Pt has had unintentional 15 lb weight loss in the past 2 months.  Patient states "I have no get up and go about me".   States he does have shortness of breath with exertion which is abnormal for him. He denies any chest pain, palpitations, dizziness, lightheadedness, weakness.  Patient has had recent travels to [place] in June and recently returned from [place] last week.  Denies sick exposures while there or eating any abnormal foods.  No fever, chills, sweats, nausea, vomiting, diarrhea.  Denies hx of PE or DVT.  No calf pain or LE edema.  VS stable on arrival.  Past Medical History  Diagnosis Date  . Asthma   . Other and unspecified hyperlipidemia   . GERD (gastroesophageal reflux disease)   . Hypercalcemia   . Skin cancer   . Colitis 2012    Colonoscopy  . External hemorrhoids without mention of complication 4332    Colonoscopy   . Diverticulosis of colon (without mention of hemorrhage) 2008    Colonoscopy  . History of colon polyps 2008    Colonoscopy  . Hiatal hernia 2008    EGD    Past Surgical History  Procedure Laterality Date  . Rectal surgery  2004    fistula repair  . Vasectomy    . Inguinal hernia repair     Family History  Problem Relation Age of  Onset  . Heart disease Father   . Alzheimer's disease Mother    History  Substance Use Topics  . Smoking status: Former Smoker -- 2.50 packs/day for 15 years    Types: Cigarettes    Quit date: 05/13/1990  . Smokeless tobacco: Never Used  . Alcohol Use: 3.0 oz/week    5 Cans of beer per week     Comment: Social    Review of Systems  Constitutional: Positive for appetite change and fatigue.  Respiratory: Positive for shortness of breath.   Neurological: Positive for weakness.  All other systems reviewed and are negative.     Allergies  Review of patient's allergies indicates no known allergies.  Home Medications   Prior to Admission medications   Medication Sig Start Date End Date Taking? Authorizing Provider  Albuterol Sulfate (PROAIR RESPICLICK) 951 (90 BASE) MCG/ACT AEPB Inhale 2 puffs into the lungs every 6 (six) hours as needed. 10/27/13   Kathee Delton, MD  aspirin 81 MG tablet Take 81 mg by mouth daily.      Historical Provider, MD  budesonide-formoterol (SYMBICORT) 160-4.5 MCG/ACT inhaler Inhale 2 puffs into the lungs 2 (two) times daily. 12/30/12 12/30/13  Kathee Delton, MD  CANASA 1000 MG suppository INSERT 1 SUPPOSITORY (1,000 MG TOTAL) RECTALLY AT BEDTIME. 03/11/12   Sable Feil, MD  cetirizine (ZYRTEC) 10 MG tablet  Take 10 mg by mouth daily.      Historical Provider, MD  Cholecalciferol (VITAMIN D-3 PO) Take 1 capsule by mouth daily.    Historical Provider, MD  diphenoxylate-atropine (LOMOTIL) 2.5-0.025 MG per tablet Take 1 tablet by mouth 4 (four) times daily as needed for diarrhea or loose stools. 09/03/13   Amy S Esterwood, PA-C  fluticasone (FLONASE) 50 MCG/ACT nasal spray Place 2 sprays into both nostrils daily as needed. 06/24/13 06/24/14  Kathee Delton, MD  guaiFENesin (MUCINEX) 600 MG 12 hr tablet Take 1,200 mg by mouth 2 (two) times daily as needed.     Historical Provider, MD  HYDROcodone-acetaminophen (NORCO/VICODIN) 5-325 MG per tablet Take 1 tablet  by mouth every 6 (six) hours as needed for moderate pain. 12/06/13   Carlos Levering Draper, DO  hydrocortisone-pramoxine [facility]) 2.5-1 % rectal cream Place rectally 2 (two) times daily. 07/06/13   Sable Feil, MD  mesalamine (LIALDA) 1.2 G EC tablet Take 2 tablets (2.4 g total) by mouth 2 (two) times daily. 11/12/12 11/12/13  Sable Feil, MD  methocarbamol (ROBAXIN) 750 MG tablet Take 1 tablet (750 mg total) by mouth every 8 (eight) hours as needed for muscle spasms. 11/24/13   Olam Idler, MD  montelukast (SINGULAIR) 10 MG tablet Take 1 tablet (10 mg total) by mouth at bedtime. 06/24/13 06/24/14  Kathee Delton, MD  predniSONE (DELTASONE) 10 MG tablet Take 6 tabs daily x 1 day; 5 tabs daily x 1 day, 4 tabs x 1 day, 3 tabs daily x 1 day, 2 tab x 1 day, then 1 tab x 1 day, then stop 11/24/13   Olam Idler, MD  RABEprazole (ACIPHEX) 20 MG tablet Take 1 tablet (20 mg total) by mouth daily. 07/06/13   Sable Feil, MD  traMADol (ULTRAM) 50 MG tablet Take 1 tablet (50 mg total) by mouth every 8 (eight) hours as needed. 11/24/13   Olam Idler, MD   BP 152/95  Pulse 75  Temp(Src) 98.7 F (37.1 C) (Oral)  Resp 17  SpO2 99%  Physical Exam  Nursing note and vitals reviewed. Constitutional: He is oriented to person, place, and time. He appears well-developed and well-nourished. No distress.  HENT:  Head: Normocephalic and atraumatic.  Mouth/Throat: Oropharynx is clear and moist.  Eyes: Conjunctivae and EOM are normal. Pupils are equal, round, and reactive to light.  Neck: Normal range of motion. Neck supple.  Cardiovascular: Normal rate, regular rhythm and normal heart sounds.   Pulmonary/Chest: Effort normal and breath sounds normal. No respiratory distress. He has no wheezes.  Respirations unlabored, coarse breath sounds left lung fields without audible wheezes, rhonchi, or rales  Abdominal: Soft. Bowel sounds are normal. There is no tenderness. There is no guarding.   Musculoskeletal: Normal range of motion.  No calf asymmetry, tenderness, or palpable cords; no overlying erythema, induration, or signs of cellulitis; DP pulses intact bilaterally  Neurological: He is alert and oriented to person, place, and time.  Skin: Skin is warm and dry. He is not diaphoretic.  Psychiatric: He has a normal mood and affect.    ED Course  Procedures (including critical care time)  CRITICAL CARE Performed by: Larene Pickett   Total critical care time: 35  Critical care time was exclusive of separately billable procedures and treating other patients.  Critical care was necessary to treat or prevent imminent or life-threatening deterioration.  Critical care was time spent personally by me on the following activities:  development of treatment plan with patient and/or surrogate as well as nursing, discussions with consultants, evaluation of patient's response to treatment, examination of patient, obtaining history from patient or surrogate, ordering and performing treatments and interventions, ordering and review of laboratory studies, ordering and review of radiographic studies, pulse oximetry and re-evaluation of patient's condition.  Labs Review Labs Reviewed  CBC - Abnormal; Notable for the following:    RBC 4.08 (*)    HCT 37.2 (*)    MCHC 36.9 (*)    All other components within normal limits  BASIC METABOLIC PANEL - Abnormal; Notable for the following:    Sodium 121 (*)    Chloride 83 (*)    Glucose, Bld 117 (*)    Anion gap 18 (*)    All other components within normal limits  PRO B NATRIURETIC PEPTIDE - Abnormal; Notable for the following:    Pro B Natriuretic peptide (BNP) 192.0 (*)    All other components within normal limits  D-DIMER, QUANTITATIVE - Abnormal; Notable for the following:    D-Dimer, Quant 3.19 (*)    All other components within normal limits  URINALYSIS, ROUTINE W REFLEX MICROSCOPIC - Abnormal; Notable for the following:    Color,  Urine AMBER (*)    Bilirubin Urine SMALL (*)    Ketones, ur 15 (*)    All other components within normal limits  CBG MONITORING, ED - Abnormal; Notable for the following:    Glucose-Capillary 127 (*)    All other components within normal limits  I-STAT TROPOININ, ED    Imaging Review Dg Chest 2 View  12/15/2013   CLINICAL DATA:  Short of breath and weakness  EXAM: CHEST  2 VIEW  COMPARISON:  Chest 06/07/2009.  FINDINGS: Heart size and vascularity are normal. Negative for pneumonia or effusion.  Right perihilar linear density most likely scarring but not definitely seen previously.  Chronic right rib fracture.  IMPRESSION: Right perihilar density has the appearance of scarring. Otherwise no acute abnormality.   Electronically Signed   By: Franchot Gallo M.D.   On: 12/15/2013 19:14   Ct Angio Chest Pe W/cm &/or Wo Cm  12/15/2013   CLINICAL DATA:  Elevated D-dimer. Shortness of breath. Recent travel.  EXAM: CT ANGIOGRAPHY CHEST WITH CONTRAST  TECHNIQUE: Multidetector CT imaging of the chest was performed using the standard protocol during bolus administration of intravenous contrast. Multiplanar CT image reconstructions and MIPs were obtained to evaluate the vascular anatomy.  CONTRAST:  12mL OMNIPAQUE IOHEXOL 350 MG/ML SOLN  COMPARISON:  01/22/2006  FINDINGS: THORACIC INLET/BODY WALL:  Incidentally and partially visualized larynx notable for enlargement of the left laryngeal ventricle and medialization of the arytenoid.  MEDIASTINUM:  Normal heart size. No pericardial effusion. Extensive coronary atherosclerosis. No acute vascular abnormality, including pulmonary embolism or aortic dissection.  Bulky lymphadenopathy throughout the mediastinum. A conglomerate of left lower peritracheal nodes measures 2.3 cm in short axis. A discrete subcarinal node measures 2.3 cm in short axis. The nodes are relatively hypoenhancing. Upper peritracheal nodes present bilaterally. Possible early spread to the right  supraclavicular station which will be better evaluated by PET.  LUNG WINDOWS:  Consolidative opacities in the medial right lower lobe. Opacity in the medial left lower lobe, measuring 3 cm in diameter, has a lobulated appearance especially on reformatted imaging. Peribronchovascular nodule in the left lower lobe measuring 19 mm in maximal diameter. This nodule compresses the segmental bronchi. Diffuse bronchial wall thickening, with mucoid impaction in the right lower  lobe.  UPPER ABDOMEN:  New lobulated liver surface with numerous ill-defined hyperdense nodules. There are at least 2 perirenal nodules around the left upper pole, measuring up to 12 mm. The adrenal glands appear thickened and mildly nodular, without measurable nodule.  OSSEOUS:  No acute fracture.  No suspicious lytic or blastic lesions.  Review of the MIP images confirms the above findings.  IMPRESSION: 1. Negative for pulmonary embolism. 2. 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 Specifically, there is a 3 cm mass like opacity in left lower lobe, bilateral mediastinal adenopathy, numerous liver masses, left perirenal nodules, and possible early adrenal involvement. 3. Airspace disease in the right lower lobe, likely pneumonia. 4. Findings of left vocal cord paralysis.  Correlate with endoscopy.   Electronically Signed   By: Jorje Guild M.D.   On: 12/15/2013 21:05     EKG Interpretation None      MDM   Final diagnoses:  CAP (community acquired pneumonia)  Lung mass  Liver mass  Hyponatremia  Generalized weakness   68 year old male daily for EKG changes and generalized weakness. Patient denies any chest pain but has had shortness of breath over the past 2 weeks. He has had multiple recent long distance plane trips to [place] and most recently [place] last week. He has no prior history of PE or DVT.  On exam he is afebrile and overall nontoxic appearing. He does not  appear fluid overloaded. He has no clinical signs or symptoms of DVT on exam.  EKG with new T wave inversions of his lateral leads. We'll obtain basic labs, troponin, d-dimer, urinalysis, and chest x-ray.  Labs revealing significant hyponatremia of 121. Chest x-ray without acute findings. 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  Long discussion with family regarding CT findings today, they acknowledged understanding and all questions were answered.  Will admit to hospitalist for above problems.  Pt started on rocephin and azithromycin for CAP.  Case discussed with Dr. Arnoldo Morale who will admit to telemetry.  Temp admission orders placed, VS remain stable.  Larene Pickett, PA-C 12/16/13 0008

## 2013-12-16 ENCOUNTER — Ambulatory Visit: Payer: BC Managed Care – PPO | Admitting: Internal Medicine

## 2013-12-16 DIAGNOSIS — J189 Pneumonia, unspecified organism: Secondary | ICD-10-CM

## 2013-12-16 DIAGNOSIS — R599 Enlarged lymph nodes, unspecified: Secondary | ICD-10-CM

## 2013-12-16 DIAGNOSIS — J45909 Unspecified asthma, uncomplicated: Secondary | ICD-10-CM

## 2013-12-16 DIAGNOSIS — R5383 Other fatigue: Secondary | ICD-10-CM

## 2013-12-16 DIAGNOSIS — E871 Hypo-osmolality and hyponatremia: Secondary | ICD-10-CM

## 2013-12-16 DIAGNOSIS — K769 Liver disease, unspecified: Secondary | ICD-10-CM

## 2013-12-16 DIAGNOSIS — R222 Localized swelling, mass and lump, trunk: Secondary | ICD-10-CM

## 2013-12-16 DIAGNOSIS — R5381 Other malaise: Secondary | ICD-10-CM

## 2013-12-16 LAB — BASIC METABOLIC PANEL
ANION GAP: 16 — AB (ref 5–15)
Anion gap: 14 (ref 5–15)
Anion gap: 14 (ref 5–15)
BUN: 14 mg/dL (ref 6–23)
BUN: 11 mg/dL (ref 6–23)
BUN: 12 mg/dL (ref 6–23)
CALCIUM: 8.6 mg/dL (ref 8.4–10.5)
CHLORIDE: 85 meq/L — AB (ref 96–112)
CHLORIDE: 90 meq/L — AB (ref 96–112)
CHLORIDE: 90 meq/L — AB (ref 96–112)
CO2: 19 meq/L (ref 19–32)
CO2: 21 meq/L (ref 19–32)
CO2: 20 meq/L (ref 19–32)
CREATININE: 0.55 mg/dL (ref 0.50–1.35)
CREATININE: 0.62 mg/dL (ref 0.50–1.35)
Calcium: 8.3 mg/dL — ABNORMAL LOW (ref 8.4–10.5)
Calcium: 8.4 mg/dL (ref 8.4–10.5)
Creatinine, Ser: 0.54 mg/dL (ref 0.50–1.35)
GFR calc Af Amer: >90 mL/min (ref >90)
GFR calc Af Amer: >90 mL/min (ref >90)
GFR calc non Af Amer: >90 mL/min (ref >90)
GFR calc non Af Amer: >90 mL/min (ref >90)
GFR calc non Af Amer: >90 mL/min (ref >90)
GLUCOSE: 113 mg/dL — AB (ref 70–99)
GLUCOSE: 108 mg/dL — AB (ref 70–99)
Glucose, Bld: 126 mg/dL — ABNORMAL HIGH (ref 70–99)
POTASSIUM: 4 meq/L (ref 3.7–5.3)
POTASSIUM: 4 meq/L (ref 3.7–5.3)
Potassium: 3.8 mEq/L (ref 3.7–5.3)
SODIUM: 120 meq/L — AB (ref 137–147)
SODIUM: 124 meq/L — AB (ref 137–147)
Sodium: 125 mEq/L — ABNORMAL LOW (ref 137–147)

## 2013-12-16 LAB — NA AND K (SODIUM & POTASSIUM), RAND UR: Potassium Urine: 46 mEq/L

## 2013-12-16 LAB — CBC
HEMATOCRIT: 35.4 % — AB (ref 39.0–52.0)
HEMOGLOBIN: 12.8 g/dL — AB (ref 13.0–17.0)
MCH: 32.7 pg (ref 26.0–34.0)
MCHC: 36.2 g/dL — ABNORMAL HIGH (ref 30.0–36.0)
MCV: 90.3 fL (ref 78.0–100.0)
Platelets: 161 10*3/uL (ref 150–400)
RBC: 3.92 MIL/uL — ABNORMAL LOW (ref 4.22–5.81)
RDW: 13 % (ref 11.5–15.5)
WBC: 5.3 10*3/uL (ref 4.0–10.5)

## 2013-12-16 LAB — GLUCOSE, CAPILLARY
GLUCOSE-CAPILLARY: 122 mg/dL — AB (ref 70–99)
Glucose-Capillary: 112 mg/dL — ABNORMAL HIGH (ref 70–99)

## 2013-12-16 LAB — OSMOLALITY, URINE: OSMOLALITY UR: 903 mosm/kg (ref 390–1090)

## 2013-12-16 MED ORDER — MESALAMINE 1000 MG RE SUPP
1000.0000 mg | Freq: Every day | RECTAL | Status: DC
  Administered 2013-12-16 – 2013-12-19 (×4): 1000 mg via RECTAL
  Filled 2013-12-16 (×12): qty 1

## 2013-12-16 MED ORDER — SODIUM CHLORIDE 0.9 % IV SOLN
INTRAVENOUS | Status: DC
Start: 2013-12-16 — End: 2013-12-17
  Administered 2013-12-16 – 2013-12-17 (×3): via INTRAVENOUS

## 2013-12-16 MED ORDER — GUAIFENESIN ER 600 MG PO TB12
1200.0000 mg | ORAL_TABLET | Freq: Two times a day (BID) | ORAL | Status: DC | PRN
  Filled 2013-12-16: qty 2

## 2013-12-16 MED ORDER — ONDANSETRON HCL 4 MG PO TABS: 4.0000 mg | ORAL_TABLET | Freq: Four times a day (QID) | ORAL | Status: DC | PRN

## 2013-12-16 MED ORDER — ALBUTEROL SULFATE (2.5 MG/3ML) 0.083% IN NEBU
2.5000 mg | INHALATION_SOLUTION | Freq: Four times a day (QID) | RESPIRATORY_TRACT | Status: DC
  Administered 2013-12-16: 2.5 mg via RESPIRATORY_TRACT
  Filled 2013-12-16: qty 3

## 2013-12-16 MED ORDER — ONDANSETRON HCL 4 MG/2ML IJ SOLN
4.0000 mg | Freq: Four times a day (QID) | INTRAMUSCULAR | Status: DC | PRN
  Administered 2013-12-24: 4 mg via INTRAVENOUS
  Filled 2013-12-16: qty 2

## 2013-12-16 MED ORDER — LORATADINE 10 MG PO TABS
10.0000 mg | ORAL_TABLET | Freq: Every day | ORAL | Status: DC
  Administered 2013-12-16 – 2013-12-25 (×9): 10 mg via ORAL
  Filled 2013-12-16 (×10): qty 1

## 2013-12-16 MED ORDER — PANTOPRAZOLE SODIUM 40 MG PO TBEC
40.0000 mg | DELAYED_RELEASE_TABLET | Freq: Every day | ORAL | Status: DC
  Administered 2013-12-16 – 2013-12-25 (×9): 40 mg via ORAL
  Filled 2013-12-16 (×10): qty 1

## 2013-12-16 MED ORDER — METHOCARBAMOL 500 MG PO TABS
750.0000 mg | ORAL_TABLET | Freq: Three times a day (TID) | ORAL | Status: DC | PRN
  Administered 2013-12-16 – 2013-12-22 (×8): 750 mg via ORAL
  Filled 2013-12-16 (×2): qty 2
  Filled 2013-12-16 (×2): qty 1
  Filled 2013-12-16: qty 2
  Filled 2013-12-16 (×3): qty 1

## 2013-12-16 MED ORDER — MONTELUKAST SODIUM 10 MG PO TABS
10.0000 mg | ORAL_TABLET | Freq: Every day | ORAL | Status: DC
  Administered 2013-12-16 – 2013-12-24 (×10): 10 mg via ORAL
  Filled 2013-12-16 (×12): qty 1

## 2013-12-16 MED ORDER — OXYCODONE HCL 5 MG PO TABS
5.0000 mg | ORAL_TABLET | ORAL | Status: DC | PRN
  Administered 2013-12-16 – 2013-12-17 (×3): 5 mg via ORAL
  Filled 2013-12-16 (×3): qty 1

## 2013-12-16 MED ORDER — HYDROMORPHONE HCL PF 1 MG/ML IJ SOLN: 0.5000 mg | INTRAMUSCULAR | Status: DC | PRN

## 2013-12-16 MED ORDER — ALUM & MAG HYDROXIDE-SIMETH 200-200-20 MG/5ML PO SUSP: 30.0000 mL | Freq: Four times a day (QID) | ORAL | Status: DC | PRN

## 2013-12-16 MED ORDER — ACETAMINOPHEN 650 MG RE SUPP: 650.0000 mg | Freq: Four times a day (QID) | RECTAL | Status: DC | PRN

## 2013-12-16 MED ORDER — FLUTICASONE PROPIONATE 50 MCG/ACT NA SUSP
2.0000 | Freq: Every day | NASAL | Status: DC | PRN
  Filled 2013-12-16: qty 16

## 2013-12-16 MED ORDER — ACETAMINOPHEN 325 MG PO TABS
650.0000 mg | ORAL_TABLET | Freq: Four times a day (QID) | ORAL | Status: DC | PRN
  Administered 2013-12-17 – 2013-12-23 (×3): 650 mg via ORAL
  Filled 2013-12-16 (×3): qty 2

## 2013-12-16 MED ORDER — VITAMIN D3 25 MCG (1000 UNIT) PO TABS
1000.0000 [IU] | ORAL_TABLET | Freq: Every morning | ORAL | Status: DC
  Administered 2013-12-16 – 2013-12-25 (×9): 1000 [IU] via ORAL
  Filled 2013-12-16 (×11): qty 1

## 2013-12-16 NOTE — Progress Notes (Signed)
Upon admission to the floor, Floor Rn was told by ER RN that patient wife gave patient two hydrocodone and one robaxin 750 mg.  RN educated patient and wife on importnance of all medications administered by Nursing Staff.  Patient wife stated "I gave it to him because for 6 hours no one here has given him anything for pain"  Patient and wife verbalized understanding in allowing only nursing staff to administer medications.  RN to continue to monitor patient

## 2013-12-16 NOTE — Progress Notes (Signed)
[facility] PROGRESS NOTE  [name] [name] NTI:[id number] DOB: 16-Apr-1946 DOA: 12/15/2013 PCP: [name], MD  Assessment/Plan: 1-Lung mass 3 cm left lower lobe/ Mediastinal adenopathy, numerous liver masses Pulmonary consulted to determine better approach for lung biopsy.   2-CAP (community acquired pneumonia) Continue with ceftriaxone and azithromycin day 2.   Hyponatremia- Differential SIADH from #1, dehydration, alcohol related . Urine osmolality pending. Repeat B-met this afternoon if sodium gets worse on IV fluids, will order water restriction. He is asymptomatic from hyponatremia.   Low back pain- Due to Musculoskeletal pain versus Metastatic Disease. Need PET scan at some point.   Alcohol use; on CIWA, ativan, thiamine and folate.   Intrinsic asthma  Stable    GERD  Protonix   Code Status: Full Code.  Family Communication: Care discussed with patient.  Disposition Plan: remain inpatient.    Consultants:  Pulmonary  Procedures:  none  Antibiotics:  Ceftriaxone  Azithromycin.   HPI/Subjective: Feeling better, back pain better controlled.  No worsening dyspnea, no significant cough.   Objective: Filed Vitals:   12/16/13 0443  BP: 154/91  Pulse: 83  Temp: 98 F (36.7 C)  Resp: 18   No intake or output data in the 24 hours ending 12/16/13 0829 Filed Weights   12/16/13 0018  Weight: 76.476 kg (168 lb 9.6 oz)    Exam:   General:  Alert in no distress.   Cardiovascular: S 1, S 2 RRR  Respiratory: few crackles left side.   Abdomen: BS present, soft, distended.   Musculoskeletal: no edema.   Data Reviewed: Basic Metabolic Panel:  Recent Labs Lab 12/15/13 1750 12/16/13 0523  NA 121* 120*  K 4.0 3.8  CL 83* 85*  CO2 20 19  GLUCOSE 117* 113*  BUN 21 14  CREATININE 0.60 0.54  CALCIUM 9.3 8.6   Liver Function Tests: No results found for this basename: AST, ALT, ALKPHOS, BILITOT, PROT, ALBUMIN,  in the last 168  hours No results found for this basename: LIPASE, AMYLASE,  in the last 168 hours No results found for this basename: AMMONIA,  in the last 168 hours CBC:  Recent Labs Lab 12/15/13 1750 12/16/13 0523  WBC 5.9 5.3  HGB 13.8 12.8*  HCT 37.2* 35.4*  MCV 91.2 90.3  PLT 183 161   Cardiac Enzymes: No results found for this basename: CKTOTAL, CKMB, CKMBINDEX, TROPONINI,  in the last 168 hours BNP (last 3 results)  Recent Labs  12/15/13 1750  PROBNP 192.0*   CBG:  Recent Labs Lab 12/15/13 1830  GLUCAP 127*    No results found for this or any previous visit (from the past 240 hour(s)).   Studies: Dg Chest 2 View  12/15/2013   CLINICAL DATA:  Short of breath and weakness  EXAM: CHEST  2 VIEW  COMPARISON:  Chest 06/07/2009.  FINDINGS: Heart size and vascularity are normal. Negative for pneumonia or effusion.  Right perihilar linear density most likely scarring but not definitely seen previously.  Chronic right rib fracture.  IMPRESSION: Right perihilar density has the appearance of scarring. Otherwise no acute abnormality.   Electronically Signed   By: Franchot Gallo M.D.   On: 12/15/2013 19:14   Ct Angio Chest Pe W/cm &/or Wo Cm  12/15/2013   CLINICAL DATA:  Elevated D-dimer. Shortness of breath. Recent travel.  EXAM: CT ANGIOGRAPHY CHEST WITH CONTRAST  TECHNIQUE: Multidetector CT imaging of the chest was performed using the standard protocol during bolus administration of intravenous contrast. Multiplanar CT  image reconstructions and MIPs were obtained to evaluate the vascular anatomy.  CONTRAST:  120m OMNIPAQUE IOHEXOL 350 MG/ML SOLN  COMPARISON:  01/22/2006  FINDINGS: THORACIC INLET/BODY WALL:  Incidentally and partially visualized larynx notable for enlargement of the left laryngeal ventricle and medialization of the arytenoid.  MEDIASTINUM:  Normal heart size. No pericardial effusion. Extensive coronary atherosclerosis. No acute vascular abnormality, including pulmonary embolism or  aortic dissection.  Bulky lymphadenopathy throughout the mediastinum. A conglomerate of left lower peritracheal nodes measures 2.3 cm in short axis. A discrete subcarinal node measures 2.3 cm in short axis. The nodes are relatively hypoenhancing. Upper peritracheal nodes present bilaterally. Possible early spread to the right supraclavicular station which will be better evaluated by PET.  LUNG WINDOWS:  Consolidative opacities in the medial right lower lobe. Opacity in the medial left lower lobe, measuring 3 cm in diameter, has a lobulated appearance especially on reformatted imaging. Peribronchovascular nodule in the left lower lobe measuring 19 mm in maximal diameter. This nodule compresses the segmental bronchi. Diffuse bronchial wall thickening, with mucoid impaction in the right lower lobe.  UPPER ABDOMEN:  New lobulated liver surface with numerous ill-defined hyperdense nodules. There are at least 2 perirenal nodules around the left upper pole, measuring up to 12 mm. The adrenal glands appear thickened and mildly nodular, without measurable nodule.  OSSEOUS:  No acute fracture.  No suspicious lytic or blastic lesions.  Review of the MIP images confirms the above findings.  IMPRESSION: 1. Negative for pulmonary embolism. 2. 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 Specifically, there is a 3 cm mass like opacity in left lower lobe, bilateral mediastinal adenopathy, numerous liver masses, left perirenal nodules, and possible early adrenal involvement. 3. Airspace disease in the right lower lobe, likely pneumonia. 4. Findings of left vocal cord paralysis.  Correlate with endoscopy.   Electronically Signed   By: JJorje GuildM.D.   On: 12/15/2013 21:05    Scheduled Meds: . albuterol  2.5 mg Nebulization Q6H  . azithromycin  500 mg Intravenous Q24H  . cefTRIAXone (ROCEPHIN)  IV  1 g Intravenous Q24H  . cholecalciferol  1,000 Units Oral q morning  - 125Q . folic acid  1 mg Oral Daily  . loratadine  10 mg Oral Daily  . LORazepam  0-4 mg Intravenous Q6H   Followed by  . [START ON 12/18/2013] LORazepam  0-4 mg Intravenous Q12H  . mesalamine  1,000 mg Rectal QHS  . montelukast  10 mg Oral QHS  . multivitamin with minerals  1 tablet Oral Daily  . pantoprazole  40 mg Oral Daily  . thiamine  100 mg Oral Daily   Or  . thiamine  100 mg Intravenous Daily   Continuous Infusions: . sodium chloride 125 mL/hr at 12/15/13 1921  . sodium chloride 75 mL/hr at 12/16/13 0102    Principal Problem:   Lung mass Active Problems:   HYPERLIPIDEMIA   Intrinsic asthma   GERD   Mediastinal adenopathy   CAP (community acquired pneumonia)   Hyponatremia   Low back pain   Weight loss    Time spent: 35 minutes.     RNiel HummerA  [facility] Pager [phone] If 7PM-7AM, please contact night-coverage at [email or web address], password [email or web address]8/10/2013, 8:29 AM  LOS: 1 day

## 2013-12-16 NOTE — ED Notes (Signed)
Pt daughter informed RN that she had given pt hydrocodone to manage his pain. Educated pt and daughter that they should not take any more of their own medications in the future. Daughter states, "he's been suffering for six hours, I couldn't watch him suffer anymore."

## 2013-12-16 NOTE — Progress Notes (Signed)
CRITICAL VALUE ALERT  Critical value received:  Sodium 120  Date of notification:  08/06/415 Time of notification:  0650   Critical value read back:yes           Nurse who received alert:  Herbie Drape rn  MD notified (1st page): hospitalist  Time of first page:0702  MD notified (2nd page):  Time of second page:  Responding MD:    Time MD responded:

## 2013-12-16 NOTE — Progress Notes (Signed)
Utilization review completed. Buddy Loeffelholz, RN, BSN. 

## 2013-12-17 ENCOUNTER — Inpatient Hospital Stay (HOSPITAL_COMMUNITY): Payer: BC Managed Care – PPO

## 2013-12-17 DIAGNOSIS — F10931 Alcohol use, unspecified with withdrawal delirium: Secondary | ICD-10-CM

## 2013-12-17 DIAGNOSIS — M549 Dorsalgia, unspecified: Secondary | ICD-10-CM

## 2013-12-17 DIAGNOSIS — F10231 Alcohol dependence with withdrawal delirium: Secondary | ICD-10-CM

## 2013-12-17 LAB — BASIC METABOLIC PANEL
ANION GAP: 14 (ref 5–15)
Anion gap: 15 (ref 5–15)
Anion gap: 14 (ref 5–15)
BUN: 8 mg/dL (ref 6–23)
BUN: 7 mg/dL (ref 6–23)
BUN: 7 mg/dL (ref 6–23)
CALCIUM: 8.7 mg/dL (ref 8.4–10.5)
CALCIUM: 8.7 mg/dL (ref 8.4–10.5)
CHLORIDE: 88 meq/L — AB (ref 96–112)
CO2: 21 mEq/L (ref 19–32)
CO2: 22 mEq/L (ref 19–32)
CO2: 23 mEq/L (ref 19–32)
CREATININE: 0.52 mg/dL (ref 0.50–1.35)
Calcium: 8.4 mg/dL (ref 8.4–10.5)
Chloride: 87 mEq/L — ABNORMAL LOW (ref 96–112)
Chloride: 87 mEq/L — ABNORMAL LOW (ref 96–112)
Creatinine, Ser: 0.51 mg/dL (ref 0.50–1.35)
Creatinine, Ser: 0.54 mg/dL (ref 0.50–1.35)
GFR calc Af Amer: >90 mL/min (ref >90)
GLUCOSE: 98 mg/dL (ref 70–99)
Glucose, Bld: 106 mg/dL — ABNORMAL HIGH (ref 70–99)
Glucose, Bld: 98 mg/dL (ref 70–99)
POTASSIUM: 4.2 meq/L (ref 3.7–5.3)
Potassium: 4.2 mEq/L (ref 3.7–5.3)
Potassium: 4.3 mEq/L (ref 3.7–5.3)
SODIUM: 123 meq/L — AB (ref 137–147)
Sodium: 124 mEq/L — ABNORMAL LOW (ref 137–147)
Sodium: 124 mEq/L — ABNORMAL LOW (ref 137–147)

## 2013-12-17 LAB — MRSA PCR SCREENING: MRSA by PCR: NEGATIVE

## 2013-12-17 MED ORDER — FOLIC ACID 1 MG PO TABS
1.0000 mg | ORAL_TABLET | Freq: Every day | ORAL | Status: DC
  Administered 2013-12-18 – 2013-12-25 (×7): 1 mg via ORAL
  Filled 2013-12-17 (×8): qty 1

## 2013-12-17 MED ORDER — LORAZEPAM 0.5 MG PO TABS
1.0000 mg | ORAL_TABLET | Freq: Four times a day (QID) | ORAL | Status: AC | PRN
  Administered 2013-12-17 – 2013-12-20 (×3): 1 mg via ORAL
  Filled 2013-12-17 (×3): qty 2

## 2013-12-17 MED ORDER — VITAMIN B-1 100 MG PO TABS: 100.0000 mg | ORAL_TABLET | Freq: Every day | ORAL | Status: DC

## 2013-12-17 MED ORDER — LORAZEPAM 0.5 MG PO TABS
1.0000 mg | ORAL_TABLET | ORAL | Status: DC | PRN
  Administered 2013-12-17: 1 mg via ORAL
  Filled 2013-12-17: qty 1

## 2013-12-17 MED ORDER — LORAZEPAM 2 MG/ML IJ SOLN
1.0000 mg | Freq: Four times a day (QID) | INTRAMUSCULAR | Status: AC | PRN
  Administered 2013-12-19 (×3): 1 mg via INTRAVENOUS
  Filled 2013-12-17 (×3): qty 1

## 2013-12-17 MED ORDER — ALBUTEROL SULFATE (2.5 MG/3ML) 0.083% IN NEBU
2.5000 mg | INHALATION_SOLUTION | Freq: Three times a day (TID) | RESPIRATORY_TRACT | Status: DC
  Administered 2013-12-17 – 2013-12-20 (×8): 2.5 mg via RESPIRATORY_TRACT
  Filled 2013-12-17 (×8): qty 3

## 2013-12-17 MED ORDER — ADULT MULTIVITAMIN W/MINERALS CH
1.0000 | ORAL_TABLET | Freq: Every day | ORAL | Status: DC
Start: 2013-12-18 — End: 2013-12-25
  Administered 2013-12-18 – 2013-12-25 (×7): 1 via ORAL
  Filled 2013-12-17 (×8): qty 1

## 2013-12-17 MED ORDER — THIAMINE HCL 100 MG/ML IJ SOLN: 100.0000 mg | Freq: Every day | INTRAMUSCULAR | Status: DC

## 2013-12-17 MED ORDER — FOLIC ACID 1 MG PO TABS
1.0000 mg | ORAL_TABLET | Freq: Every day | ORAL | Status: DC
  Filled 2013-12-17: qty 1

## 2013-12-17 MED ORDER — HYDROMORPHONE HCL 2 MG PO TABS
1.0000 mg | ORAL_TABLET | Freq: Four times a day (QID) | ORAL | Status: DC | PRN
  Administered 2013-12-18 – 2013-12-21 (×8): 1 mg via ORAL
  Filled 2013-12-17 (×8): qty 1

## 2013-12-17 MED ORDER — LORAZEPAM 2 MG/ML IJ SOLN: 1.0000 mg | INTRAMUSCULAR | Status: DC | PRN

## 2013-12-17 NOTE — Progress Notes (Signed)
Patient transferred in bed with oxygen to 2C18, no s/s of distress noted throughout transfer, breathing regular and non-labored throughout transfer.    Dirk Dress 12/17/2013

## 2013-12-17 NOTE — Progress Notes (Signed)
[facility] PROGRESS NOTE  [name] [name] QQI:[id number] DOB: Mar 01, 1946 DOA: 12/15/2013 PCP: [name], MD  Assessment/Plan: Alcohol Withdrawal:  on CIWA, ativan, thiamine and folate.  Patient lethargic, wake up to voice, disoriented. Follow some command.  Patient has been agitated overnight and during nurse evaluation. He has required IV ativan.  I will transfer patient to Step Down unit. Continue with CIWA protocol. Will hold schedule ativan to avoid oversedation.  I have consulted CCM to help with management.   Encephalopathy: probably related to alcohol withdrawal. Sodium level low but improving since admission. Will get CT head.   Hyponatremia- Differential SIADH from #1, dehydration, alcohol related . Urine osmolality 900. Sodium initially improving with IV fluids. Urine osmolality elevated. Will do water restriction. Will check B-met every 8 hour.   Lung mass 3 cm left lower lobe/ Mediastinal adenopathy, numerous liver masses Need PET Scan first to determine better approach for biopsy.   -CAP (community acquired pneumonia) Continue with ceftriaxone and azithromycin day 3.    Low back pain- Due to Musculoskeletal pain versus Metastatic Disease. Need PET scan at some point. Dr Cramps was recommending oral dilaudid for pain management. When patient less sedated could try oral dilaudid.     Intrinsic asthma  Stable    GERD  Protonix   Code Status: Full Code.  Family Communication: Care discussed with patient and wife who was at bedside.  Disposition Plan: remain inpatient.    Consultants:  Pulmonary, CCM  Procedures:  none  Antibiotics:  Ceftriaxone  Azithromycin.   HPI/Subjective: Patient lethargic, open eyes to command, move upper extremities to command.  He is confuse, not oriented to person, time , place or situation.  Denies pain.   Objective: Filed Vitals:   12/17/13 0901  BP: 168/93  Pulse: 87  Temp: 97.2 F (36.2 C)  Resp:      Intake/Output Summary (Last 24 hours) at 12/17/13 1124 Last data filed at 12/17/13 0803  Gross per 24 hour  Intake    480 ml  Output    600 ml  Net   -120 ml   Filed Weights   12/16/13 0018  Weight: 76.476 kg (168 lb 9.6 oz)    Exam:   General:  Lethargic, open eyes to voice, follow some command.   Cardiovascular: S 1, S 2 RRR  Respiratory: few crackles left side.   Abdomen: BS present, soft, distended.   Musculoskeletal: no edema.   Data Reviewed: Basic Metabolic Panel:  Recent Labs Lab 12/15/13 1750 12/16/13 0523 12/16/13 1240 12/16/13 1911 12/17/13 0350  NA 121* 120* 125* 124* 123*  K 4.0 3.8 4.0 4.0 4.2  CL 83* 85* 90* 90* 88*  CO2 _0 GLUCOSE 117* 113* 108* 126* 106*  BUN _1 CREATININE 0.60 0.54 0.55 0.62 0.51  CALCIUM 9.3 8.6 8.3* 8.4 8.4   Liver Function Tests: No results found for this basename: AST, ALT, ALKPHOS, BILITOT, PROT, ALBUMIN,  in the last 168 hours No results found for this basename: LIPASE, AMYLASE,  in the last 168 hours No results found for this basename: AMMONIA,  in the last 168 hours CBC:  Recent Labs Lab 12/15/13 1750 12/16/13 0523  WBC 5.9 5.3  HGB 13.8 12.8*  HCT 37.2* 35.4*  MCV 91.2 90.3  PLT 183 161   Cardiac Enzymes: No results found for this basename: CKTOTAL, CKMB, CKMBINDEX, TROPONINI,  in the last 168 hours BNP (last 3 results)  Recent  Labs  12/15/13 1750  PROBNP 192.0*   CBG:  Recent Labs Lab 12/15/13 1830 12/16/13 1123 12/16/13 1613  GLUCAP 127* 112* 122*    No results found for this or any previous visit (from the past 240 hour(s)).   Studies: Dg Chest 2 View  12/15/2013   CLINICAL DATA:  Short of breath and weakness  EXAM: CHEST  2 VIEW  COMPARISON:  Chest 06/07/2009.  FINDINGS: Heart size and vascularity are normal. Negative for pneumonia or effusion.  Right perihilar linear density most likely scarring but not definitely seen previously.  Chronic right rib  fracture.  IMPRESSION: Right perihilar density has the appearance of scarring. Otherwise no acute abnormality.   Electronically Signed   By: Franchot Gallo M.D.   On: 12/15/2013 19:14   Ct Angio Chest Pe W/cm &/or Wo Cm  12/15/2013   CLINICAL DATA:  Elevated D-dimer. Shortness of breath. Recent travel.  EXAM: CT ANGIOGRAPHY CHEST WITH CONTRAST  TECHNIQUE: Multidetector CT imaging of the chest was performed using the standard protocol during bolus administration of intravenous contrast. Multiplanar CT image reconstructions and MIPs were obtained to evaluate the vascular anatomy.  CONTRAST:  1101mL OMNIPAQUE IOHEXOL 350 MG/ML SOLN  COMPARISON:  01/22/2006  FINDINGS: THORACIC INLET/BODY WALL:  Incidentally and partially visualized larynx notable for enlargement of the left laryngeal ventricle and medialization of the arytenoid.  MEDIASTINUM:  Normal heart size. No pericardial effusion. Extensive coronary atherosclerosis. No acute vascular abnormality, including pulmonary embolism or aortic dissection.  Bulky lymphadenopathy throughout the mediastinum. A conglomerate of left lower peritracheal nodes measures 2.3 cm in short axis. A discrete subcarinal node measures 2.3 cm in short axis. The nodes are relatively hypoenhancing. Upper peritracheal nodes present bilaterally. Possible early spread to the right supraclavicular station which will be better evaluated by PET.  LUNG WINDOWS:  Consolidative opacities in the medial right lower lobe. Opacity in the medial left lower lobe, measuring 3 cm in diameter, has a lobulated appearance especially on reformatted imaging. Peribronchovascular nodule in the left lower lobe measuring 19 mm in maximal diameter. This nodule compresses the segmental bronchi. Diffuse bronchial wall thickening, with mucoid impaction in the right lower lobe.  UPPER ABDOMEN:  New lobulated liver surface with numerous ill-defined hyperdense nodules. There are at least 2 perirenal nodules around the left  upper pole, measuring up to 12 mm. The adrenal glands appear thickened and mildly nodular, without measurable nodule.  OSSEOUS:  No acute fracture.  No suspicious lytic or blastic lesions.  Review of the MIP images confirms the above findings.  IMPRESSION: 1. Negative for pulmonary embolism. 2. 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 Specifically, there is a 3 cm mass like opacity in left lower lobe, bilateral mediastinal adenopathy, numerous liver masses, left perirenal nodules, and possible early adrenal involvement. 3. Airspace disease in the right lower lobe, likely pneumonia. 4. Findings of left vocal cord paralysis.  Correlate with endoscopy.   Electronically Signed   By: Jorje Guild M.D.   On: 12/15/2013 21:05    Scheduled Meds: . albuterol  2.5 mg Nebulization TID  . azithromycin  500 mg Intravenous Q24H  . cefTRIAXone (ROCEPHIN)  IV  1 g Intravenous Q24H  . cholecalciferol  1,000 Units Oral q morning - 33L  . folic acid  1 mg Oral Daily  . loratadine  10 mg Oral Daily  . mesalamine  1,000 mg Rectal QHS  . montelukast  10 mg Oral QHS  . multivitamin  with minerals  1 tablet Oral Daily  . pantoprazole  40 mg Oral Daily  . thiamine  100 mg Oral Daily   Or  . thiamine  100 mg Intravenous Daily   Continuous Infusions:    Principal Problem:   Lung mass Active Problems:   HYPERLIPIDEMIA   Intrinsic asthma   GERD   Mediastinal adenopathy   CAP (community acquired pneumonia)   Hyponatremia   Low back pain   Weight loss    Time spent: 35 minutes.     [name] A  [facility] Pager [phone]. If 7PM-7AM, please contact night-coverage at [email or web address], password [facility] 12/17/2013, 11:24 AM  LOS: 2 days

## 2013-12-17 NOTE — Progress Notes (Signed)
Report given to [facility] nurse.  Patient to be transferred to 2c18 shortly.  Will continue to monitor.  Dirk Dress 12/17/2013

## 2013-12-17 NOTE — Care Management Note (Signed)
Page 1 of 2   12/23/2013     1:24:53 PM CARE MANAGEMENT NOTE 12/23/2013  Patient:  [name] [name]   Account Number:  [id number]  Date Initiated:  12/17/2013  Documentation initiated by:  AMERSON,JULIE  Subjective/Objective Assessment:   Pt adm on 12/15/13 with SOB, progressive weakness.  Lung mass and liver lesions found on CT.  PTA, pt resides at home and is independent.  Per report, is separated from wife, who is at bedside.     Action/Plan:   Will follow for dc needs as pt progresses.  CSW consulted for ETOH counseling.   Anticipated DC Date:  12/24/2013   Anticipated DC Plan:  HOME W HOME HEALTH SERVICES      DC Planning Services  CM consult      PAC Choice  HOME HEALTH   Choice offered to / List presented to:  C-3 Spouse   DME arranged  CANE        HH arranged  HH-2 PT  HH-3 OT      HH agency  [facility]   Status of service:  Completed, signed off Medicare Important Message given?   (If response is "NO", the following Medicare IM given date fields will be blank) Date Medicare IM given:   Medicare IM given by:   Date Additional Medicare IM given:   Additional Medicare IM given by:    Discharge Disposition:  HOME W HOME HEALTH SERVICES  Per UR Regulation:  Reviewed for med. necessity/level of care/duration of stay  If discussed at Long Length of Stay Meetings, dates discussed:    Comments:  12/23/13 13:25 CM met with pt and wife, [name] [name],[phone] in room ot offer choice for home health agency.  Family chooses [facility] to render HHPT/OT.  Address and contact numbers verified with [name] [name].  Referral called to [facility] rep, Kristen. DME rep to bring cane to room prior to discharge.  No other CM needs were communicated.   , BSn, CM [phone].   [id number]/Rhonda Davis, RN, BSN, CCM: [phone] Chart reviewed for discharge needs. Patient transferred to [facility] from the [facility] for [facility] treatments/patient remains confused/s/p  liver biospy on [id number]/question of mestatic progress to the brain and liver. Next chart review due on [id number].   

## 2013-12-17 NOTE — Consult Note (Signed)
Name: [name] [name] MRN: [id number] DOB: July 10, 1945    ADMISSION DATE:  12/15/2013 CONSULTATION DATE:  12/15/2013  REFERRING MD :  EDP  CHIEF COMPLAINT:  Weakness.   BRIEF PATIENT DESCRIPTION:   68 yo male former smoker and hx of ETOH was sent to ED from PCP office to evaluate weakness, dyspnea, weight loss and abnormal ECG.  He had CT chest which was concerning for metastatic lung cancer.  He is followed by Dr. Gwenette Greet in pulmonary office for asthma.  SIGNIFICANT EVENTS: 8/05 Admit 8/07 PCCM reconsulted to assess mental status  STUDIES:  8/5 CTA chest >> extensive CAD, 2.3 cm paratracheal LAN, 2.3 cm subcarinal LAN, possible Rt supraclavicular LAN, RLL consolidation, 3 cm density LLL, 1.9 cm nodule LLL, mucoid impaction RLL, numerous nodules in liver, ?Lt VC paralysis  SUBJECTIVE:  Sleepy.  VITAL SIGNS: Temp:  [97.2 F (36.2 C)-98.9 F (37.2 C)] 97.2 F (36.2 C) (08/07 0901) Pulse Rate:  [87-97] 89 (08/07 1122) Resp:  [18] 18 (08/07 0351) BP: (141-168)/(81-103) 156/103 mmHg (08/07 1122) SpO2:  [88 %-98 %] 97 % (08/07 1125)  PHYSICAL EXAMINATION: General: no distress Neuro: sleepy, mumbles with stimulation, moves extremities HEENT: pupils reactive Cardiovascular: regular Lungs: scattered rhonchi Abdomen: soft, non tender Musculoskeletal: no edema Skin: no rashes  CBC Recent Labs     12/15/13  1750  12/16/13  0523  WBC  5.9  5.3  HGB  13.8  12.8*  HCT  37.2*  35.4*  PLT  183  161   BMET Recent Labs     12/16/13  1240  12/16/13  1911  12/17/13  0350  NA  125*  124*  123*  K  4.0  4.0  4.2  CL  90*  90*  88*  CO2  21  20  21   BUN  11  12  8   CREATININE  0.55  0.62  0.51  GLUCOSE  108*  126*  106*    Electrolytes Recent Labs     12/16/13  1240  12/16/13  1911  12/17/13  0350  CALCIUM  8.3*  8.4  8.4   Cardiac Enzymes Recent Labs     12/15/13  1750  PROBNP  192.0*    Glucose Recent Labs     12/15/13  1830  12/16/13  1123  12/16/13  1613  GLUCAP  127*  112*  122*    Imaging Dg Chest 2 View  12/15/2013   CLINICAL DATA:  Short of breath and weakness  EXAM: CHEST  2 VIEW  COMPARISON:  Chest 06/07/2009.  FINDINGS: Heart size and vascularity are normal. Negative for pneumonia or effusion.  Right perihilar linear density most likely scarring but not definitely seen previously.  Chronic right rib fracture.  IMPRESSION: Right perihilar density has the appearance of scarring. Otherwise no acute abnormality.   Electronically Signed   By: Franchot Gallo M.D.   On: 12/15/2013 19:14   Ct Angio Chest Pe W/cm &/or Wo Cm  12/15/2013   CLINICAL DATA:  Elevated D-dimer. Shortness of breath. Recent travel.  EXAM: CT ANGIOGRAPHY CHEST WITH CONTRAST  TECHNIQUE: Multidetector CT imaging of the chest was performed using the standard protocol during bolus administration of intravenous contrast. Multiplanar CT image reconstructions and MIPs were obtained to evaluate the vascular anatomy.  CONTRAST:  154mL OMNIPAQUE IOHEXOL 350 MG/ML SOLN  COMPARISON:  01/22/2006  FINDINGS: THORACIC INLET/BODY WALL:  Incidentally and partially visualized larynx notable for enlargement of the left laryngeal ventricle and  medialization of the arytenoid.  MEDIASTINUM:  Normal heart size. No pericardial effusion. Extensive coronary atherosclerosis. No acute vascular abnormality, including pulmonary embolism or aortic dissection.  Bulky lymphadenopathy throughout the mediastinum. A conglomerate of left lower peritracheal nodes measures 2.3 cm in short axis. A discrete subcarinal node measures 2.3 cm in short axis. The nodes are relatively hypoenhancing. Upper peritracheal nodes present bilaterally. Possible early spread to the right supraclavicular station which will be better evaluated by PET.  LUNG WINDOWS:  Consolidative opacities in the medial right lower lobe. Opacity in the medial left lower lobe, measuring 3 cm in diameter, has a lobulated appearance especially on reformatted  imaging. Peribronchovascular nodule in the left lower lobe measuring 19 mm in maximal diameter. This nodule compresses the segmental bronchi. Diffuse bronchial wall thickening, with mucoid impaction in the right lower lobe.  UPPER ABDOMEN:  New lobulated liver surface with numerous ill-defined hyperdense nodules. There are at least 2 perirenal nodules around the left upper pole, measuring up to 12 mm. The adrenal glands appear thickened and mildly nodular, without measurable nodule.  OSSEOUS:  No acute fracture.  No suspicious lytic or blastic lesions.  Review of the MIP images confirms the above findings.  IMPRESSION: 1. Negative for pulmonary embolism. 2. 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 Specifically, there is a 3 cm mass like opacity in left lower lobe, bilateral mediastinal adenopathy, numerous liver masses, left perirenal nodules, and possible early adrenal involvement. 3. Airspace disease in the right lower lobe, likely pneumonia. 4. Findings of left vocal cord paralysis.  Correlate with endoscopy.   Electronically Signed   By: Jorje Guild M.D.   On: 12/15/2013 21:05      ASSESSMENT / PLAN:  68 yo male with hx of smoking now with weight loss, failure to thrive, hyponatremia, and findings on CT chest concerning for primary lung cancer (main concern for SCLC).  Probable lung cancer >> he will need tissue diagnosis with biopsy to further assess. Plan: Will need to arrange for PET scan to assess best approach for Bx >> this will need to be arranged for as outpt  Pulmonary follow up visit with Dr. Gwenette Greet on Thursday, August 13 at 10 am  Consolidative changes on CT chest ?PNA vs malignancy. Plan: Day 3/x rocephin, zithromax per primary team >> likely can transition to oral Abx soon  Hx of Asthma. 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 Plan: Per primary team  Alcohol abuse with concern for ETOH withdrawal. More lethargic 8/07 >> likely from ativan (received 10 mg total from 8/06 to 8/07) Plan: Monitor CIWA q4h Given ativan 1 to 4 mg q4h prn for CIWA > 8 F/u CT head Continue thiamine, folic acid  Updated family at bedside D/w Dr. Tyrell Antonio  PCCM can be available as needed over weekend >> please call if further help needed.  Chesley Mires, MD [facility] Pulmonary/Critical Care 12/17/2013, 1:31 PM Pager:  [phone] After 3pm call: [phone]

## 2013-12-17 NOTE — ED Provider Notes (Signed)
Medical screening examination/treatment/procedure(s) were conducted as a shared visit with non-physician practitioner(s) and myself.  I personally evaluated the patient during the encounter. 68yo M, c/o 2 weeks generalized weakness/fatigue, SOB esp on exertion, as well as 15# unintentional wt loss over the past 2 months. Seen by PMD today and sent to the ED for further evaluation. VSS, resps without distress, RRR, neuro non-focal. Labs with hyponatremia. CT chest without PE, +lung mass as well as underlying pneumonia. IV abx, admit.    CRITICAL CARE Performed by: Alfonzo Feller Total critical care time: 35 Critical care time was exclusive of separately billable procedures and treating other patients. Critical care was necessary to treat or prevent imminent or life-threatening deterioration. Critical care was time spent personally by me on the following activities: development of treatment plan with patient and/or surrogate as well as nursing, discussions with consultants, evaluation of patient's response to treatment, examination of patient, obtaining history from patient or surrogate, ordering and performing treatments and interventions, ordering and review of laboratory studies, ordering and review of radiographic studies, pulse oximetry and re-evaluation of patient's condition.       Francine Graven, DO 12/17/13 0008

## 2013-12-18 LAB — HEPATIC FUNCTION PANEL
ALT: 150 U/L — AB (ref 0–53)
AST: 141 U/L — AB (ref 0–37)
Albumin: 2.9 g/dL — ABNORMAL LOW (ref 3.5–5.2)
Alkaline Phosphatase: 542 U/L — ABNORMAL HIGH (ref 39–117)
BILIRUBIN DIRECT: 0.9 mg/dL — AB (ref 0.0–0.3)
BILIRUBIN TOTAL: 1.9 mg/dL — AB (ref 0.3–1.2)
Indirect Bilirubin: 1 mg/dL — ABNORMAL HIGH (ref 0.3–0.9)
Total Protein: 6.1 g/dL (ref 6.0–8.3)

## 2013-12-18 LAB — CBC
HCT: 32.9 % — ABNORMAL LOW (ref 39.0–52.0)
Hemoglobin: 12.1 g/dL — ABNORMAL LOW (ref 13.0–17.0)
MCH: 32.9 pg (ref 26.0–34.0)
MCHC: 36.8 g/dL — ABNORMAL HIGH (ref 30.0–36.0)
MCV: 89.4 fL (ref 78.0–100.0)
Platelets: 127 10*3/uL — ABNORMAL LOW (ref 150–400)
RBC: 3.68 MIL/uL — AB (ref 4.22–5.81)
RDW: 12.8 % (ref 11.5–15.5)
WBC: 6.5 10*3/uL (ref 4.0–10.5)

## 2013-12-18 LAB — BASIC METABOLIC PANEL
Anion gap: 17 — ABNORMAL HIGH (ref 5–15)
Anion gap: 16 — ABNORMAL HIGH (ref 5–15)
Anion gap: 17 — ABNORMAL HIGH (ref 5–15)
BUN: 8 mg/dL (ref 6–23)
BUN: 10 mg/dL (ref 6–23)
BUN: 10 mg/dL (ref 6–23)
CALCIUM: 8.7 mg/dL (ref 8.4–10.5)
CALCIUM: 8.9 mg/dL (ref 8.4–10.5)
CALCIUM: 8.9 mg/dL (ref 8.4–10.5)
CO2: 21 mEq/L (ref 19–32)
CO2: 22 meq/L (ref 19–32)
CO2: 20 mEq/L (ref 19–32)
CREATININE: 0.48 mg/dL — AB (ref 0.50–1.35)
Chloride: 87 mEq/L — ABNORMAL LOW (ref 96–112)
Chloride: 85 mEq/L — ABNORMAL LOW (ref 96–112)
Chloride: 86 mEq/L — ABNORMAL LOW (ref 96–112)
Creatinine, Ser: 0.53 mg/dL (ref 0.50–1.35)
Creatinine, Ser: 0.53 mg/dL (ref 0.50–1.35)
GFR calc Af Amer: >90 mL/min (ref >90)
GFR calc Af Amer: >90 mL/min (ref >90)
GFR calc Af Amer: >90 mL/min (ref >90)
GFR calc non Af Amer: >90 mL/min (ref >90)
GFR calc non Af Amer: >90 mL/min (ref >90)
GLUCOSE: 102 mg/dL — AB (ref 70–99)
GLUCOSE: 111 mg/dL — AB (ref 70–99)
Glucose, Bld: 102 mg/dL — ABNORMAL HIGH (ref 70–99)
POTASSIUM: 3.8 meq/L (ref 3.7–5.3)
Potassium: 3.8 mEq/L (ref 3.7–5.3)
Potassium: 3.8 mEq/L (ref 3.7–5.3)
SODIUM: 123 meq/L — AB (ref 137–147)
SODIUM: 123 meq/L — AB (ref 137–147)
Sodium: 125 mEq/L — ABNORMAL LOW (ref 137–147)

## 2013-12-18 LAB — AMMONIA: AMMONIA: 23 umol/L (ref 11–60)

## 2013-12-18 MED ORDER — AMOXICILLIN-POT CLAVULANATE 875-125 MG PO TABS
1.0000 | ORAL_TABLET | Freq: Two times a day (BID) | ORAL | Status: AC
  Administered 2013-12-18 – 2013-12-23 (×12): 1 via ORAL
  Filled 2013-12-18 (×13): qty 1

## 2013-12-18 NOTE — Progress Notes (Addendum)
[facility] PROGRESS NOTE  [name] [name] VQQ:[id number] DOB: 01/31/1946 DOA: 12/15/2013 PCP: [name], MD  Assessment/Plan: Alcohol Withdrawal:  on CIWA, ativan, thiamine and folate.  Patient today is alert, following command, he is oriented to person and place.  Ativan PRN.  Continue to monitor in the step Down unit.   Encephalopathy: probably related to alcohol withdrawal. Sodium level low but improving since admission.  CT head : There are inflammatory changes in the left maxillary sinus and left mastoid air cells. Chronic ischemic changes and atrophy are noted. Check LFT and ammonia level.   Hyponatremia- Differential SIADH from #1, dehydration, alcohol related . Urine osmolality 900. Sodium initially improving with IV fluids. Urine osmolality elevated.  Continue with water restriction. Sodium increasing today at 125.   Lung mass 3 cm left lower lobe/ Mediastinal adenopathy, numerous liver masses Need PET Scan first to determine better approach for biopsy.   -CAP (community acquired pneumonia) Received ceftriaxone and azithromycin day 3.  Will transition to Augmentin today.   Low back pain- Due to Musculoskeletal pain versus Metastatic Disease. Need PET scan at some point. Dr Cramps was recommending oral dilaudid for pain management. Relates Back pain is 2/10.   Intrinsic asthma  Stable    GERD  Protonix   Code Status: Full Code.  Family Communication: Care discussed with patient and wife who was at bedside.  Disposition Plan: remain inpatient.    Consultants:  Pulmonary, CCM  Procedures:  none  Antibiotics:  Ceftriaxone 8-5---8-8  Azithromycin. 8-5----8-8  Augmentin. 8-8  HPI/Subjective: Alert , awake, was able to tell me he was at [name] [name], his wife name.  Back pain only 2/10.  He wants to call his wife.   Objective: Filed Vitals:   12/18/13 0432  BP: 118/77  Pulse: 81  Temp: 97.8 F (36.6 C)  Resp: 21    Intake/Output  Summary (Last 24 hours) at 12/18/13 0747 Last data filed at 12/18/13 0236  Gross per 24 hour  Intake      0 ml  Output   1225 ml  Net  -1225 ml   Filed Weights   12/16/13 0018 12/18/13 0634  Weight: 76.476 kg (168 lb 9.6 oz) 73.982 kg (163 lb 1.6 oz)    Exam:   General:  Alert, awake, following command.   Cardiovascular: S 1, S 2 RRR  Respiratory: few crackles left side.   Abdomen: BS present, soft, distended.   Musculoskeletal: no edema.   Data Reviewed: Basic Metabolic Panel:  Recent Labs Lab 12/16/13 1911 12/17/13 0350 12/17/13 1435 12/17/13 1911 12/18/13 0338  NA 124* 123* 124* 124* 125*  K 4.0 4.2 4.2 4.3 3.8  CL 90* 88* 87* 87* 87*  CO2 20 21 22 23 21   GLUCOSE 126* 106* 98 98 102*  BUN 12 8 7 7 8   CREATININE 0.62 0.51 0.52 0.54 0.48*  CALCIUM 8.4 8.4 8.7 8.7 8.7   Liver Function Tests: No results found for this basename: AST, ALT, ALKPHOS, BILITOT, PROT, ALBUMIN,  in the last 168 hours No results found for this basename: LIPASE, AMYLASE,  in the last 168 hours No results found for this basename: AMMONIA,  in the last 168 hours CBC:  Recent Labs Lab 12/15/13 1750 12/16/13 0523  WBC 5.9 5.3  HGB 13.8 12.8*  HCT 37.2* 35.4*  MCV 91.2 90.3  PLT 183 161   Cardiac Enzymes: No results found for this basename: CKTOTAL, CKMB, CKMBINDEX, TROPONINI,  in the last 168 hours BNP (  last 3 results)  Recent Labs  12/15/13 1750  PROBNP 192.0*   CBG:  Recent Labs Lab 12/15/13 1830 12/16/13 1123 12/16/13 1613  GLUCAP 127* 112* 122*    Recent Results (from the past 240 hour(s))  MRSA PCR SCREENING     Status: None   Collection Time    12/17/13  5:00 PM      Result Value Ref Range Status   MRSA by PCR NEGATIVE  NEGATIVE Final   Comment:            The GeneXpert MRSA Assay (FDA     approved for NASAL specimens     only), is one component of a     comprehensive MRSA colonization     surveillance program. It is not     intended to diagnose MRSA      infection nor to guide or     monitor treatment for     MRSA infections.     Studies: Ct Head Wo Contrast  12/17/2013   CLINICAL DATA:  Altered mental status  EXAM: CT HEAD WITHOUT CONTRAST  TECHNIQUE: Contiguous axial images were obtained from the base of the skull through the vertex without intravenous contrast.  COMPARISON:  None.  FINDINGS: Chronic ischemic changes in the periventricular white matter. Mild global atrophy. No mass effect, midline shift, or acute intracranial hemorrhage. There is an air-fluid level in the left maxillary sinus associated with mucosal thickening. Visualize right maxillary sinuses clear. Mastoid air cells on the right are clear. Trace fluid in the dependent left mastoid air cells.  IMPRESSION: There are inflammatory changes in the left maxillary sinus and left mastoid air cells. Chronic ischemic changes and atrophy are noted.   Electronically Signed   By: Maryclare Bean M.D.   On: 12/17/2013 13:51    Scheduled Meds: . albuterol  2.5 mg Nebulization TID  . azithromycin  500 mg Intravenous Q24H  . cefTRIAXone (ROCEPHIN)  IV  1 g Intravenous Q24H  . cholecalciferol  1,000 Units Oral q morning - 32I  . folic acid  1 mg Oral Daily  . loratadine  10 mg Oral Daily  . mesalamine  1,000 mg Rectal QHS  . montelukast  10 mg Oral QHS  . multivitamin with minerals  1 tablet Oral Daily  . pantoprazole  40 mg Oral Daily  . thiamine  100 mg Oral Daily   Or  . thiamine  100 mg Intravenous Daily   Continuous Infusions:    Principal Problem:   Lung mass Active Problems:   HYPERLIPIDEMIA   Intrinsic asthma   GERD   Mediastinal adenopathy   CAP (community acquired pneumonia)   Hyponatremia   Low back pain   Weight loss    Time spent: 35 minutes.     [name] A  [facility] Pager [phone]. If 7PM-7AM, please contact night-coverage at [email or web address], password [facility] 12/18/2013, 7:47 AM  LOS: 3 days

## 2013-12-19 ENCOUNTER — Inpatient Hospital Stay (HOSPITAL_COMMUNITY): Payer: BC Managed Care – PPO

## 2013-12-19 DIAGNOSIS — C3490 Malignant neoplasm of unspecified part of unspecified bronchus or lung: Secondary | ICD-10-CM

## 2013-12-19 DIAGNOSIS — I82220 Acute embolism and thrombosis of inferior vena cava: Secondary | ICD-10-CM

## 2013-12-19 DIAGNOSIS — Z87891 Personal history of nicotine dependence: Secondary | ICD-10-CM

## 2013-12-19 DIAGNOSIS — R740 Nonspecific elevation of levels of transaminase and lactic acid dehydrogenase [LDH]: Secondary | ICD-10-CM

## 2013-12-19 DIAGNOSIS — C787 Secondary malignant neoplasm of liver and intrahepatic bile duct: Secondary | ICD-10-CM

## 2013-12-19 DIAGNOSIS — R7401 Elevation of levels of liver transaminase levels: Secondary | ICD-10-CM

## 2013-12-19 LAB — BASIC METABOLIC PANEL
ANION GAP: 16 — AB (ref 5–15)
ANION GAP: 14 (ref 5–15)
BUN: 12 mg/dL (ref 6–23)
BUN: 12 mg/dL (ref 6–23)
CHLORIDE: 84 meq/L — AB (ref 96–112)
CO2: 20 mEq/L (ref 19–32)
CO2: 22 meq/L (ref 19–32)
Calcium: 8.8 mg/dL (ref 8.4–10.5)
Calcium: 8.8 mg/dL (ref 8.4–10.5)
Chloride: 90 mEq/L — ABNORMAL LOW (ref 96–112)
Creatinine, Ser: 0.55 mg/dL (ref 0.50–1.35)
Creatinine, Ser: 0.57 mg/dL (ref 0.50–1.35)
GFR calc Af Amer: >90 mL/min (ref >90)
GFR calc non Af Amer: >90 mL/min (ref >90)
Glucose, Bld: 108 mg/dL — ABNORMAL HIGH (ref 70–99)
Glucose, Bld: 103 mg/dL — ABNORMAL HIGH (ref 70–99)
POTASSIUM: 4 meq/L (ref 3.7–5.3)
POTASSIUM: 4.1 meq/L (ref 3.7–5.3)
SODIUM: 120 meq/L — AB (ref 137–147)
SODIUM: 126 meq/L — AB (ref 137–147)

## 2013-12-19 LAB — CBC
HCT: 32.7 % — ABNORMAL LOW (ref 39.0–52.0)
Hemoglobin: 11.9 g/dL — ABNORMAL LOW (ref 13.0–17.0)
MCH: 33.3 pg (ref 26.0–34.0)
MCHC: 36.4 g/dL — ABNORMAL HIGH (ref 30.0–36.0)
MCV: 91.6 fL (ref 78.0–100.0)
Platelets: 123 10*3/uL — ABNORMAL LOW (ref 150–400)
RBC: 3.57 MIL/uL — AB (ref 4.22–5.81)
RDW: 13.1 % (ref 11.5–15.5)
WBC: 6.1 10*3/uL (ref 4.0–10.5)

## 2013-12-19 LAB — LACTATE DEHYDROGENASE: LDH: 1359 U/L — AB (ref 94–250)

## 2013-12-19 MED ORDER — ENSURE COMPLETE PO LIQD
237.0000 mL | Freq: Three times a day (TID) | ORAL | Status: DC
  Administered 2013-12-19 – 2013-12-25 (×9): 237 mL via ORAL

## 2013-12-19 MED ORDER — BUDESONIDE-FORMOTEROL FUMARATE 160-4.5 MCG/ACT IN AERO
2.0000 | INHALATION_SPRAY | Freq: Two times a day (BID) | RESPIRATORY_TRACT | Status: DC
  Administered 2013-12-19 – 2013-12-25 (×11): 2 via RESPIRATORY_TRACT
  Filled 2013-12-19 (×3): qty 6

## 2013-12-19 MED ORDER — SODIUM CHLORIDE 0.9 % IV SOLN
INTRAVENOUS | Status: DC
  Administered 2013-12-19 – 2013-12-20 (×2): via INTRAVENOUS
  Administered 2013-12-20: 1000 mL via INTRAVENOUS
  Administered 2013-12-21 – 2013-12-25 (×8): via INTRAVENOUS

## 2013-12-19 NOTE — Progress Notes (Signed)
PT Cancellation Note  Patient Details Name: [name] [name] MRN: [id number] DOB: 03-Jul-1945   Cancelled Treatment:    Reason Eval/Treat Not Completed: Patient at procedure or test/unavailable.   Duncan Dull 12/19/2013, 8:34 AM Alben Deeds, PT DPT  [phone]

## 2013-12-19 NOTE — Progress Notes (Signed)
[facility] PROGRESS NOTE  [name] [name] FIE:[id number] DOB: Jan 17, 1946 DOA: 12/15/2013 PCP: [name], MD  Assessment/Plan: Alcohol Withdrawal:  on CIWA, ativan, thiamine and folate.   he is oriented to person and place. Still confuse.  Ativan PRN.  Continue to monitor in the step Down unit.   Encephalopathy: probably related to alcohol withdrawal. Sodium level low but improving since admission.  CT head : There are inflammatory changes in the left maxillary sinus and left mastoid air cells. Chronic ischemic changes and atrophy are noted. Check LFT and ammonia level.  Ammonia level normal.  Hyponatremia- Differential SIADH from #1, dehydration, alcohol related . Urine osmolality 900. Sodium initially improving with IV fluids. Urine osmolality elevated.  sodium decrease to 120 overnight.  it has improved to 126 with IV fluids. Follow trend.   Lung mass 3 cm left lower lobe/ Mediastinal adenopathy, numerous liver masses Need PET Scan first to determine better approach for biopsy.   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 [name] consulted.   -CAP (community acquired pneumonia) Received ceftriaxone and azithromycin day 3.  Continue with  Augmentin, day 4 of antibiotics,  Low back pain- Due to Musculoskeletal pain versus Metastatic Disease. Need PET scan at some point. Dr Cramps was recommending oral dilaudid for pain management. Pain better controlled.   Intrinsic asthma  Stable    GERD  Protonix   Code Status: Full Code.  Family Communication: Care discussed with patient and wife who was at bedside.  Disposition Plan: remain inpatient.    Consultants:  Pulmonary, CCM  [name].   Procedures:  none  Antibiotics:  Ceftriaxone 8-5---8-8  Azithromycin. 8-5----8-8  Augmentin. 8-8  HPI/Subjective: He received ativan, early this morning.  He wake up, answer some question. He denies dyspnea, has productive cough.  He was oriented to  place, person, still confuse.   Objective: Filed Vitals:   12/19/13 1100  BP: 139/87  Pulse: 90  Temp: 98.1 F (36.7 C)  Resp: 19    Intake/Output Summary (Last 24 hours) at 12/19/13 1311 Last data filed at 12/18/13 1857  Gross per 24 hour  Intake    120 ml  Output      0 ml  Net    120 ml   Filed Weights   12/16/13 0018 12/18/13 0634 12/19/13 0419  Weight: 76.476 kg (168 lb 9.6 oz) 73.982 kg (163 lb 1.6 oz) 74.3 kg (163 lb 12.8 oz)    Exam:   General:  Sedated, wake up to answer question,  following command.   Cardiovascular: S 1, S 2 RRR  Respiratory: CTA  Abdomen: BS present, soft, distended.   Musculoskeletal: no edema.   Data Reviewed: Basic Metabolic Panel:  Recent Labs Lab 12/18/13 0338 12/18/13 1210 12/18/13 1854 12/19/13 0350 12/19/13 1101  NA 125* 123* 123* 120* 126*  K 3.8 3.8 3.8 4.0 4.1  CL 87* 85* 86* 84* 90*  CO2 21 22 20 20 22   GLUCOSE 102* 102* 111* 108* 103*  BUN 8 10 10 12 12   CREATININE 0.48* 0.53 0.53 0.55 0.57  CALCIUM 8.7 8.9 8.9 8.8 8.8   Liver Function Tests:  Recent Labs Lab 12/18/13 0830  AST 141*  ALT 150*  ALKPHOS 542*  BILITOT 1.9*  PROT 6.1  ALBUMIN 2.9*   No results found for this basename: LIPASE, AMYLASE,  in the last 168 hours  Recent Labs Lab 12/18/13 0830  AMMONIA 23   CBC:  Recent Labs Lab 12/15/13 1750 12/16/13 0523 12/18/13  0830 12/19/13 0350  WBC 5.9 5.3 6.5 6.1  HGB 13.8 12.8* 12.1* 11.9*  HCT 37.2* 35.4* 32.9* 32.7*  MCV 91.2 90.3 89.4 91.6  PLT 183 161 127* 123*   Cardiac Enzymes: No results found for this basename: CKTOTAL, CKMB, CKMBINDEX, TROPONINI,  in the last 168 hours BNP (last 3 results)  Recent Labs  12/15/13 1750  PROBNP 192.0*   CBG:  Recent Labs Lab 12/15/13 1830 12/16/13 1123 12/16/13 1613  GLUCAP 127* 112* 122*    Recent Results (from the past 240 hour(s))  MRSA PCR SCREENING     Status: None   Collection Time    12/17/13  5:00 PM      Result Value  Ref Range Status   MRSA by PCR NEGATIVE  NEGATIVE Final   Comment:            The GeneXpert MRSA Assay (FDA     approved for NASAL specimens     only), is one component of a     comprehensive MRSA colonization     surveillance program. It is not     intended to diagnose MRSA     infection nor to guide or     monitor treatment for     MRSA infections.     Studies: Ct Head Wo Contrast  12/17/2013   CLINICAL DATA:  Altered mental status  EXAM: CT HEAD WITHOUT CONTRAST  TECHNIQUE: Contiguous axial images were obtained from the base of the skull through the vertex without intravenous contrast.  COMPARISON:  None.  FINDINGS: Chronic ischemic changes in the periventricular white matter. Mild global atrophy. No mass effect, midline shift, or acute intracranial hemorrhage. There is an air-fluid level in the left maxillary sinus associated with mucosal thickening. Visualize right maxillary sinuses clear. Mastoid air cells on the right are clear. Trace fluid in the dependent left mastoid air cells.  IMPRESSION: There are inflammatory changes in the left maxillary sinus and left mastoid air cells. Chronic ischemic changes and atrophy are noted.   Electronically Signed   By: Maryclare Bean M.D.   On: 12/17/2013 13:51   US Abdomen Limited Ruq  12/19/2013   CLINICAL DATA:  68 year old male with elevated LFTs.  EXAM: US ABDOMEN LIMITED - RIGHT UPPER QUADRANT  COMPARISON:  History of lung mass.  FINDINGS: Gallbladder:  Gallbladder sludge and gallbladder polyps are noted. No definite cholelithiasis or evidence of acute cholecystitis identified.  Common bile duct:  Diameter: 4 mm. There is no evidence of intrahepatic or extrahepatic biliary dilatation.  Liver:  The liver is heterogeneous with the suggestion of multiple hepatic masses. The largest apparent mass measures 2.3 x 1.9 x 3.1 cm in the left lobe. A slightly irregular ectatic contour were may represent cirrhosis.  A 1 x 1.8 cm hyperechoic structure within the IVC  at the level of the upper liver could represent bland or tumor thrombus.  IMPRESSION: Heterogeneous liver with suggestion of multiple hepatic masses and possible cirrhosis. CT or MRI with contrast is recommended for further evaluation.  1 x 1.8 cm apparent filling defect within the IVC at the level of the upper liver, question bland or tumor thrombus.  Gallbladder sludge and polyps.   Electronically Signed   By: Hassan Rowan M.D.   On: 12/19/2013 08:52    Scheduled Meds: . albuterol  2.5 mg Nebulization TID  . amoxicillin-clavulanate  1 tablet Oral Q12H  . cholecalciferol  1,000 Units Oral q morning - 10X  . folic acid  1 mg Oral Daily  . loratadine  10 mg Oral Daily  . mesalamine  1,000 mg Rectal QHS  . montelukast  10 mg Oral QHS  . multivitamin with minerals  1 tablet Oral Daily  . pantoprazole  40 mg Oral Daily  . thiamine  100 mg Oral Daily   Or  . thiamine  100 mg Intravenous Daily   Continuous Infusions: . sodium chloride 50 mL/hr at 12/19/13 0800    Principal Problem:   Lung mass Active Problems:   HYPERLIPIDEMIA   Intrinsic asthma   GERD   Mediastinal adenopathy   CAP (community acquired pneumonia)   Hyponatremia   Low back pain   Weight loss    Time spent: 35 minutes.     [name] A  [facility] Pager [phone]. If 7PM-7AM, please contact night-coverage at [email or web address], password [facility] 12/19/2013, 1:11 PM  LOS: 4 days

## 2013-12-19 NOTE — Consult Note (Signed)
i saw [name] this afternoon. His wife and dgtr were with him.  I would be shocked if this was NOT small cell lung cancer. He is heavy smoker.  Has mostly mediastinal disease with adenopathy.  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  He may also have a neuroogical paraneoplastic process, which is most often seen with small cell lung ca.   His LDH is very high - this goes along with extensive hepatic involvement by malignancy.  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  The liver is the easiest site to biopsy.  I suspect extensive involvement.  I do not see how a PET scan is going to help --we already know that he has extensive stage disease.  An MRI of the brain will be helpful -he may still have CNS mets despite a (-) CT scan.  2 weeks ago he was in great shape.  Went on a trip to [place] and had no problems.He has been exercising regularly.  I will leave a full consult note once we have path confirmation of malignancy.  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  Systemic chemo is the only option here.  He and his family know that this malignancy is NOT curable, but definitely treatable.  Pete E.

## 2013-12-20 ENCOUNTER — Inpatient Hospital Stay (HOSPITAL_COMMUNITY): Payer: BC Managed Care – PPO

## 2013-12-20 ENCOUNTER — Encounter (HOSPITAL_COMMUNITY): Payer: Self-pay | Admitting: Radiology

## 2013-12-20 DIAGNOSIS — G9340 Encephalopathy, unspecified: Secondary | ICD-10-CM

## 2013-12-20 LAB — BASIC METABOLIC PANEL
ANION GAP: 16 — AB (ref 5–15)
Anion gap: 16 — ABNORMAL HIGH (ref 5–15)
BUN: 11 mg/dL (ref 6–23)
BUN: 9 mg/dL (ref 6–23)
CALCIUM: 8.6 mg/dL (ref 8.4–10.5)
CHLORIDE: 91 meq/L — AB (ref 96–112)
CO2: 21 meq/L (ref 19–32)
CO2: 19 mEq/L (ref 19–32)
Calcium: 8.6 mg/dL (ref 8.4–10.5)
Chloride: 89 mEq/L — ABNORMAL LOW (ref 96–112)
Creatinine, Ser: 0.53 mg/dL (ref 0.50–1.35)
Creatinine, Ser: 0.43 mg/dL — ABNORMAL LOW (ref 0.50–1.35)
GFR calc Af Amer: >90 mL/min (ref >90)
GFR calc non Af Amer: >90 mL/min (ref >90)
GLUCOSE: 104 mg/dL — AB (ref 70–99)
Glucose, Bld: 96 mg/dL (ref 70–99)
POTASSIUM: 3.9 meq/L (ref 3.7–5.3)
Potassium: 3.9 mEq/L (ref 3.7–5.3)
SODIUM: 126 meq/L — AB (ref 137–147)
Sodium: 126 mEq/L — ABNORMAL LOW (ref 137–147)

## 2013-12-20 LAB — T4, FREE: Free T4: 0.73 ng/dL — ABNORMAL LOW (ref 0.80–1.80)

## 2013-12-20 LAB — CBC
HCT: 31.7 % — ABNORMAL LOW (ref 39.0–52.0)
HEMOGLOBIN: 11.3 g/dL — AB (ref 13.0–17.0)
MCH: 32.1 pg (ref 26.0–34.0)
MCHC: 35.6 g/dL (ref 30.0–36.0)
MCV: 90.1 fL (ref 78.0–100.0)
PLATELETS: 126 10*3/uL — AB (ref 150–400)
RBC: 3.52 MIL/uL — ABNORMAL LOW (ref 4.22–5.81)
RDW: 13.1 % (ref 11.5–15.5)
WBC: 7.3 10*3/uL (ref 4.0–10.5)

## 2013-12-20 LAB — VITAMIN B12: Vitamin B-12: 1605 pg/mL — ABNORMAL HIGH (ref 211–911)

## 2013-12-20 LAB — PROTIME-INR
INR: 1.07 (ref 0.00–1.49)
PROTHROMBIN TIME: 13.9 s (ref 11.6–15.2)

## 2013-12-20 LAB — TSH: TSH: 6.04 u[IU]/mL — AB (ref 0.350–4.500)

## 2013-12-20 LAB — T3, FREE: T3, Free: 2.7 pg/mL (ref 2.3–4.2)

## 2013-12-20 MED ORDER — ALLOPURINOL 100 MG PO TABS
100.0000 mg | ORAL_TABLET | Freq: Every day | ORAL | Status: DC
  Administered 2013-12-21 – 2013-12-25 (×5): 100 mg via ORAL
  Filled 2013-12-20 (×6): qty 1

## 2013-12-20 MED ORDER — ALPRAZOLAM 0.5 MG PO TABS: 1.0000 mg | ORAL_TABLET | Freq: Once | ORAL | Status: DC

## 2013-12-20 MED ORDER — HYDROMORPHONE HCL PF 1 MG/ML IJ SOLN
INTRAMUSCULAR | Status: AC
  Administered 2013-12-20: 0.5 mg
  Filled 2013-12-20: qty 1

## 2013-12-20 MED ORDER — LACTULOSE 10 GM/15ML PO SOLN
20.0000 g | Freq: Every day | ORAL | Status: DC
  Administered 2013-12-20 – 2013-12-22 (×3): 20 g via ORAL
  Filled 2013-12-20 (×4): qty 30

## 2013-12-20 MED ORDER — GADOBENATE DIMEGLUMINE 529 MG/ML IV SOLN
15.0000 mL | Freq: Once | INTRAVENOUS | Status: AC | PRN
  Administered 2013-12-20: 15 mL via INTRAVENOUS

## 2013-12-20 MED ORDER — FENTANYL CITRATE 0.05 MG/ML IJ SOLN
INTRAMUSCULAR | Status: AC | PRN
  Administered 2013-12-20: 25 ug via INTRAVENOUS

## 2013-12-20 MED ORDER — HYDROMORPHONE HCL PF 1 MG/ML IJ SOLN
0.5000 mg | Freq: Once | INTRAMUSCULAR | Status: AC
  Administered 2013-12-20: 0.5 mg via INTRAVENOUS

## 2013-12-20 MED ORDER — MIDAZOLAM HCL 2 MG/2ML IJ SOLN
INTRAMUSCULAR | Status: AC | PRN
  Administered 2013-12-20: 1 mg via INTRAVENOUS

## 2013-12-20 MED ORDER — LIDOCAINE HCL (PF) 1 % IJ SOLN
INTRAMUSCULAR | Status: AC
  Filled 2013-12-20: qty 10

## 2013-12-20 MED ORDER — GELATIN ABSORBABLE 12-7 MM EX MISC
CUTANEOUS | Status: AC
  Filled 2013-12-20: qty 1

## 2013-12-20 MED ORDER — MIDAZOLAM HCL 2 MG/2ML IJ SOLN
INTRAMUSCULAR | Status: AC
  Filled 2013-12-20: qty 2

## 2013-12-20 MED ORDER — FENTANYL CITRATE 0.05 MG/ML IJ SOLN
INTRAMUSCULAR | Status: AC
  Filled 2013-12-20: qty 2

## 2013-12-20 NOTE — Progress Notes (Addendum)
[facility] PROGRESS NOTE  [name] [name] AYT:[id number] DOB: 1945-11-11 DOA: 12/15/2013 PCP: [name], MD  Assessment/Plan: 68 year old with PMH significant for Asthma, prior smoker, admitted 8-5 with SOB for 2 weeks prior to admission. 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 He was seen by his PCP the day of admission and had an EKG and was sent to the ED for a further workup. A d-Dimer was performed and thus a CTA of the Chest was performed to rule out a Pulmonary Embolism and the findings were Negative for a PE but revealed a 3 cm LLL mass, mediastinal and perirenal adenopathy, numerous Liver lesions, and Airspace Disease of the RLL. He was started on IV antibiotics. He has history of alcohol intake. Pulmonary was consulted for evaluation of lung mass. [name] recommend out patient PET scan and depending on results they will determine biopsy. Patient over 24 hour become agitated, confused, tremors. He was started on CIWA protocol, Ativan. His sodium level on admission was 120, with IV fluids has increase to 126. His hyponatremia is probably multifactorial SIADH and component of dehydration. Need to monitor sodium closely. If worsening hyponatremia might need water restriction. Due to liver lesion on CT chest, LFT and RUQ [place] was ordered. US showed multiple liver lesions, signs of cirrhosis, and possible tumor vs thrombus IVC. Hematologist consulted for further recommendation regarding biopsy and possible IVC tumor vs thrombus. [name] recommend liver biopsy and MRI brain. MRI brain showed multiples brain lesion consistent with metastasis diseases. Plan to transfer patient to [facility] for further treatment.    Encephalopathy: Could be related to alcohol withdrawal, also neuroendocrine process from malignancy. . Sodium level low but improving since admission.  CT head : There are inflammatory  changes in the left maxillary sinus and left mastoid air cells. Chronic ischemic changes and atrophy are noted. Check LFT and ammonia level. Ammonia level normal. MRI pending.  Increase TSH, will check free T 3 and T 4. B 12 pending.   Alcohol Withdrawal:  on CIWA, ativan, thiamine and folate.  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  Ativan PRN.  Continue to monitor in the step Down unit.   Constipation; start lactulose.   Hyponatremia- Differential SIADH from #1, dehydration, alcohol related . Urine osmolality 900. Sodium initially improving with IV fluids. Urine osmolality elevated.  sodium decrease to 120 overnight 8-9.  it has improved to 126 with IV fluids. Follow trend.   Thrombocytopenia; follow trend.   Lung mass 3 cm left lower lobe/ Mediastinal adenopathy, numerous liver masses Need PET Scan first to determine better approach for biopsy.   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 [name] consulted.  I Really Appreciate [name] help.  Plan for liver biopsy today. MRI brain.   -CAP (community acquired pneumonia) Received ceftriaxone and azithromycin day 3.  Continue with  Augmentin, day 5/7 of antibiotics,  Low back pain- Due to Musculoskeletal pain versus Metastatic Disease. Need PET scan at some point. Dr Cramps was recommending oral dilaudid for pain management. Pain better controlled.   Intrinsic asthma  Stable    GERD  Protonix   DVT prophylaxis: SCD, need Lovenox post biopsy.   Code Status: Full Code.  Family Communication: Care discussed with patient and wife  who was at bedside.  Disposition Plan: remain inpatient.    Consultants:  Pulmonary, CCM  [name].   Procedures:  none  Antibiotics:  Ceftriaxone 8-5---8-8  Azithromycin. 8-5----8-8  Augmentin. 8-8  HPI/Subjective: He is alert, in no distress. Following command. 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 He didn't know why he was in the hospital. I explain that to him.   Objective: Filed Vitals:   12/20/13 0600  BP: 159/98  Pulse:   Temp:   Resp: 20    Intake/Output Summary (Last 24 hours) at 12/20/13 0816 Last data filed at 12/20/13 0600  Gross per 24 hour  Intake   2180 ml  Output   1025 ml  Net   1155 ml   Filed Weights   12/16/13 0018 12/18/13 0634 12/23/13 0419  Weight: 76.476 kg (168 lb 9.6 oz) 73.982 kg (163 lb 1.6 oz) 74.3 kg (163 lb 12.8 oz)    Exam:   General: Alert, wake up to answer question,  following command.   Cardiovascular: S 1, S 2 RRR  Respiratory: CTA  Abdomen: BS present, soft, distended.   Musculoskeletal: no edema.   Data Reviewed: Basic Metabolic Panel:  Recent Labs Lab 12/18/13 1210 12/18/13 1854 12/23/13 0350 12/23/13 1101 12/20/13 0008  NA 123* 123* 120* 126* 126*  K 3.8 3.8 4.0 4.1 3.9  CL 85* 86* 84* 90* 89*  CO2 22 20 20 22 21   GLUCOSE 102* 111* 108* 103* 104*  BUN 10 10 12 12 11   CREATININE 0.53 0.53 0.55 0.57 0.53  CALCIUM 8.9 8.9 8.8 8.8 8.6   Liver Function Tests:  Recent Labs Lab 12/18/13 0830  AST 141*  ALT 150*  ALKPHOS 542*  BILITOT 1.9*  PROT 6.1  ALBUMIN 2.9*   No results found for this basename: LIPASE, AMYLASE,  in the last 168 hours  Recent Labs Lab 12/18/13 0830  AMMONIA 23   CBC:  Recent Labs Lab 12/15/13 1750 12/16/13 0523 12/18/13 0830 December 23, 2013 0350 12/20/13 0008  WBC 5.9 5.3 6.5 6.1 7.3  HGB 13.8 12.8* 12.1* 11.9* 11.3*  HCT 37.2* 35.4* 32.9* 32.7* 31.7*  MCV 91.2 90.3 89.4 91.6 90.1  PLT 183 161 127* 123* 126*   Cardiac Enzymes: No results found for this basename: CKTOTAL, CKMB, CKMBINDEX, TROPONINI,  in the last 168 hours BNP (last 3 results)  Recent Labs  12/15/13 1750  PROBNP 192.0*   CBG:  Recent Labs Lab 12/15/13 1830 12/16/13 1123 12/16/13 1613  GLUCAP 127* 112* 122*    Recent  Results (from the past 240 hour(s))  MRSA PCR SCREENING     Status: None   Collection Time    12/17/13  5:00 PM      Result Value Ref Range Status   MRSA by PCR NEGATIVE  NEGATIVE Final   Comment:            The GeneXpert MRSA Assay (FDA     approved for NASAL specimens     only), is one component of a     comprehensive MRSA colonization     surveillance program. It is not     intended to diagnose MRSA     infection nor to guide or     monitor treatment for     MRSA infections.     Studies: US Abdomen Limited Ruq  Dec 23, 2013   CLINICAL DATA:  68 year old male with elevated  LFTs.  EXAM: US ABDOMEN LIMITED - RIGHT UPPER QUADRANT  COMPARISON:  History of lung mass.  FINDINGS: Gallbladder:  Gallbladder sludge and gallbladder polyps are noted. No definite cholelithiasis or evidence of acute cholecystitis identified.  Common bile duct:  Diameter: 4 mm. There is no evidence of intrahepatic or extrahepatic biliary dilatation.  Liver:  The liver is heterogeneous with the suggestion of multiple hepatic masses. The largest apparent mass measures 2.3 x 1.9 x 3.1 cm in the left lobe. A slightly irregular ectatic contour were may represent cirrhosis.  A 1 x 1.8 cm hyperechoic structure within the IVC at the level of the upper liver could represent bland or tumor thrombus.  IMPRESSION: Heterogeneous liver with suggestion of multiple hepatic masses and possible cirrhosis. CT or MRI with contrast is recommended for further evaluation.  1 x 1.8 cm apparent filling defect within the IVC at the level of the upper liver, question bland or tumor thrombus.  Gallbladder sludge and polyps.   Electronically Signed   By: Hassan Rowan M.D.   On: 12/19/2013 08:52    Scheduled Meds: . albuterol  2.5 mg Nebulization TID  . amoxicillin-clavulanate  1 tablet Oral Q12H  . budesonide-formoterol  2 puff Inhalation BID  . cholecalciferol  1,000 Units Oral q morning - 10a  . feeding supplement (ENSURE COMPLETE)  237 mL Oral TID WC   . folic acid  1 mg Oral Daily  . loratadine  10 mg Oral Daily  . mesalamine  1,000 mg Rectal QHS  . montelukast  10 mg Oral QHS  . multivitamin with minerals  1 tablet Oral Daily  . pantoprazole  40 mg Oral Daily  . thiamine  100 mg Oral Daily   Or  . thiamine  100 mg Intravenous Daily   Continuous Infusions: . sodium chloride 50 mL/hr at 12/20/13 0508    Principal Problem:   Lung mass Active Problems:   HYPERLIPIDEMIA   Intrinsic asthma   GERD   Mediastinal adenopathy   CAP (community acquired pneumonia)   Hyponatremia   Low back pain   Weight loss    Time spent: 35 minutes.     [name] A  [facility] Pager [phone]. If 7PM-7AM, please contact night-coverage at [email or web address], password [email or web address] 12/20/2013, 8:16 AM  LOS: 5 days

## 2013-12-20 NOTE — Progress Notes (Signed)
[name] [name]   DOB:03/02/1946   EG#:[id number]   VPX#:[id number]  Subjective: No new issues overnight. For biopsy of liver lesion today. Appears comfortable.   Scheduled Meds: . albuterol  2.5 mg Nebulization TID  . ALPRAZolam  1 mg Oral Once  . amoxicillin-clavulanate  1 tablet Oral Q12H  . budesonide-formoterol  2 puff Inhalation BID  . cholecalciferol  1,000 Units Oral q morning - 10a  . feeding supplement (ENSURE COMPLETE)  237 mL Oral TID WC  . folic acid  1 mg Oral Daily  . HYDROmorphone      .  HYDROmorphone (DILAUDID) injection  0.5 mg Intravenous Once  . lactulose  20 g Oral Daily  . loratadine  10 mg Oral Daily  . mesalamine  1,000 mg Rectal QHS  . montelukast  10 mg Oral QHS  . multivitamin with minerals  1 tablet Oral Daily  . pantoprazole  40 mg Oral Daily  . thiamine  100 mg Oral Daily   Or  . thiamine  100 mg Intravenous Daily   Continuous Infusions: . sodium chloride 50 mL/hr at 12/20/13 0508   PRN Meds:.acetaminophen, acetaminophen, albuterol, alum & mag hydroxide-simeth, fluticasone, guaiFENesin, HYDROmorphone, LORazepam, LORazepam, methocarbamol, ondansetron (ZOFRAN) IV, ondansetron   Objective:  Filed Vitals:   12/20/13 0925  BP: 140/91  Pulse: 84  Temp:   Resp: 30      Intake/Output Summary (Last 24 hours) at 12/20/13 1130 Last data filed at 12/20/13 0900  Gross per 24 hour  Intake   2180 ml  Output   1025 ml  Net   1155 ml    GENERAL:alert, no distress and comfortable SKIN: skin color, texture, turgor are normal, no rashes or significant lesions EYES: normal, conjunctiva are pink and non-injected, sclera clear OROPHARYNX:no exudate, no erythema and lips, buccal mucosa, and tongue normal  NECK: supple, thyroid normal size, non-tender, without nodularity LYMPH:  no palpable lymphadenopathy in the cervical, axillary or inguinal LUNGS: clear to auscultation and percussion with normal breathing effort HEART: regular rate & rhythm and no  murmurs and no lower extremity edema ABDOMEN:abdomen soft, non-tender and normal bowel sounds Musculoskeletal:no cyanosis of digits and no clubbing  PSYCH: alert & oriented x 3 with fluent speech NEURO: no focal motor/sensory deficits    CBG (last 3)  No results found for this basename: GLUCAP,  in the last 72 hours   Labs:   Recent Labs Lab 12/15/13 1750 12/16/13 0523 12/18/13 0830 12/19/13 0350 12/20/13 0008  WBC 5.9 5.3 6.5 6.1 7.3  HGB 13.8 12.8* 12.1* 11.9* 11.3*  HCT 37.2* 35.4* 32.9* 32.7* 31.7*  PLT 183 161 127* 123* 126*  MCV 91.2 90.3 89.4 91.6 90.1  MCH 33.8 32.7 32.9 33.3 32.1  MCHC 36.9* 36.2* 36.8* 36.4* 35.6  RDW 13.0 13.0 12.8 13.1 13.1     Chemistries:    Recent Labs Lab 12/18/13 0830 12/18/13 1210 12/18/13 1854 12/19/13 0350 12/19/13 1101 12/20/13 0008  NA  --  123* 123* 120* 126* 126*  K  --  3.8 3.8 4.0 4.1 3.9  CL  --  85* 86* 84* 90* 89*  CO2  --  22 20 20 22 21   GLUCOSE  --  102* 111* 108* 103* 104*  BUN  --  10 10 12 12 11   CREATININE  --  0.53 0.53 0.55 0.57 0.53  CALCIUM  --  8.9 8.9 8.8 8.8 8.6  AST 141*  --   --   --   --   --  ALT 150*  --   --   --   --   --   ALKPHOS 542*  --   --   --   --   --   BILITOT 1.9*  --   --   --   --   --     GFR Estimated Creatinine Clearance: 94.2 ml/min (by C-G formula based on Cr of 0.53).  Liver Function Tests:  Recent Labs Lab 12/18/13 0830  AST 141*  ALT 150*  ALKPHOS 542*  BILITOT 1.9*  PROT 6.1  ALBUMIN 2.9*   Recent Labs Lab 12/18/13 0830  AMMONIA 23     CBG:  Recent Labs Lab 12/15/13 1830 12/16/13 1123 12/16/13 1613  GLUCAP 127* 112* 122*   Thyroid function studies  Recent Labs  12/20/13 0008  TSH 6.040*   Microbiology Recent Results (from the past 240 hour(s))  MRSA PCR SCREENING     Status: None   Collection Time    12/17/13  5:00 PM      Result Value Ref Range Status   MRSA by PCR NEGATIVE  NEGATIVE Final   Comment:            The GeneXpert  MRSA Assay (FDA     approved for NASAL specimens     only), is one component of a     comprehensive MRSA colonization     surveillance program. It is not     intended to diagnose MRSA     infection nor to guide or     monitor treatment for     MRSA infections.       Imaging Studies:  US Abdomen Limited Ruq  12/19/2013   CLINICAL DATA:  68 year old male with elevated LFTs.  EXAM: US ABDOMEN LIMITED - RIGHT UPPER QUADRANT  COMPARISON:  History of lung mass.  FINDINGS: Gallbladder:  Gallbladder sludge and gallbladder polyps are noted. No definite cholelithiasis or evidence of acute cholecystitis identified.  Common bile duct:  Diameter: 4 mm. There is no evidence of intrahepatic or extrahepatic biliary dilatation.  Liver:  The liver is heterogeneous with the suggestion of multiple hepatic masses. The largest apparent mass measures 2.3 x 1.9 x 3.1 cm in the left lobe. A slightly irregular ectatic contour were may represent cirrhosis.  A 1 x 1.8 cm hyperechoic structure within the IVC at the level of the upper liver could represent bland or tumor thrombus.  IMPRESSION: Heterogeneous liver with suggestion of multiple hepatic masses and possible cirrhosis. CT or MRI with contrast is recommended for further evaluation.  1 x 1.8 cm apparent filling defect within the IVC at the level of the upper liver, question bland or tumor thrombus.  Gallbladder sludge and polyps.   Electronically Signed   By: Hassan Rowan M.D.   On: 12/19/2013 08:52    Assessment/Plan: 68 y.o. 1. metastatic bronchogenic carcinoma believe that this will be small cell lung cancer versus other malignancy  The hyponatremia, the neurological issues, the bulky mediastinal adenopathy, all go along with a small-cell lung cancer.  Liver biopsy to be done today for tissue diagnosis.  MRI of the brain pending  2. Encephalopathy Rule out mets to the brain. MRI pending Could be also related to ETOH withdrawal monitored and managed by IM team.    3. Hyponatremia Secondary to malignancy, rule out SIADH Appreciate IM management. Na improved from 120 to 126 today.  Other medical issues as per admitting team  4. Thrombocytopenia In the setting of  malignancy and ETOH history No bleeding issues noted.  IVC thrombus is likely tumor. Anticoagulation is on hold as he is to undergo biopsy.  No platelet transfusion indicated at this time.    5. Pain Controlled with current regimen.   Other medical issues as per admitting team.   **Disclaimer: This note was dictated with voice recognition software. 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 The MRI shows multiple, small lesions. There is no associated edema.  Again, I suspect all this is from small cell lung cancer.  He will have, or may already have had his liver biopsy. I suspect that we are probably will get a diagnosis tomorrow.  His sodium is 126. This is stable. 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  The TSH is slightly elevated. I really don't think that he has hypothyroidism. I probably would not treat the TSH elevation.  It is obvious that his systemic disease is quite significant. I suspect that he probably has a large amount of hepatic involvement.  We actually may have to worry about tumor lysis syndrome with therapy. I will put him on allopurinol.  We will have him go him over to [facility]. I will probably need to get a Port-A-Cath in him. This I think will be him is helpful for IV access.  Laurey Arrow

## 2013-12-20 NOTE — Progress Notes (Signed)
Patient ID: [name] [name], male   DOB: Oct 23, 1945, 68 y.o.   MRN: [id number]   S: [name] [name] is a patient of my partner, Dr. Saintclair Halsted; Dr. Saintclair Halsted has seen the patient recently regarding back pain, and referred him to me for non-operative/interventional pain management. In the interval, the patient was hospitalized for dyspnea and confusion and was found to have a metastatic process for which the primary source is currently being sought. The patient's family--in particular the spouse, whom I know-- asked Dr. Saintclair Halsted if I would be available and I have stopped in this afternoon to talk to the patient.   It is clear from our conversation that the patient still has some degree of confusion, but his family reports that both his confusion and his dyspnea are much improved.  Nonetheless, he is able to report lumbar pain that radiates to the sides, but not into his legs. He denies symptoms that would be classified as radicular. He has no dysesthesias, or numbness. He has a 25# reported weight loss, and has generalized subjective weakness, but not give way or foot drop. Back pain worsens with sitting, back extension.   O:  Filed Vitals:   12/20/13 1600  BP: 139/82  Pulse: 83  Temp: 98.3 F (36.8 C)  Resp: 22   Filed Weights   12/16/13 0018 12/18/13 0634 12/19/13 0419  Weight: 76.476 kg (168 lb 9.6 oz) 73.982 kg (163 lb 1.6 oz) 74.3 kg (163 lb 12.8 oz)    GEN: thin but well developed, NAD CARD: NSR PULM no wheezes MS: demonstrates full ROM extrem    Results for orders placed during the hospital encounter of 12/15/13 (from the past 48 hour(s))  BASIC METABOLIC PANEL     Status: Abnormal   Collection Time    12/18/13  6:54 PM      Result Value Ref Range   Sodium 123 (*) 137 - 147 mEq/L   Potassium 3.8  3.7 - 5.3 mEq/L   Chloride 86 (*) 96 - 112 mEq/L   CO2 20  19 - 32 mEq/L   Glucose, Bld 111 (*) 70 - 99 mg/dL   BUN 10  6 - 23 mg/dL   Creatinine, Ser 0.53  0.50 - 1.35 mg/dL   Calcium 8.9  8.4 -  10.5 mg/dL   GFR calc non Af Amer >90  >90 mL/min   GFR calc Af Amer >90  >90 mL/min   Comment: (NOTE)     The eGFR has been calculated using the CKD EPI equation.     This calculation has not been validated in all clinical situations.     eGFR's persistently <90 mL/min signify possible Chronic Kidney     Disease.   Anion gap 17 (*) 5 - 15  BASIC METABOLIC PANEL     Status: Abnormal   Collection Time    12/19/13  3:50 AM      Result Value Ref Range   Sodium 120 (*) 137 - 147 mEq/L   Comment: CRITICAL RESULT CALLED TO, READ BACK BY AND VERIFIED WITH:     OUSTERMAN R,RN 12/19/13 0508 WAYK   Potassium 4.0  3.7 - 5.3 mEq/L   Chloride 84 (*) 96 - 112 mEq/L   CO2 20  19 - 32 mEq/L   Glucose, Bld 108 (*) 70 - 99 mg/dL   BUN 12  6 - 23 mg/dL   Creatinine, Ser 0.55  0.50 - 1.35 mg/dL   Calcium 8.8  8.4 - 10.5 mg/dL  GFR calc non Af Amer >90  >90 mL/min   GFR calc Af Amer >90  >90 mL/min   Comment: (NOTE)     The eGFR has been calculated using the CKD EPI equation.     This calculation has not been validated in all clinical situations.     eGFR's persistently <90 mL/min signify possible Chronic Kidney     Disease.   Anion gap 16 (*) 5 - 15  CBC     Status: Abnormal   Collection Time    12/19/13  3:50 AM      Result Value Ref Range   WBC 6.1  4.0 - 10.5 K/uL   RBC 3.57 (*) 4.22 - 5.81 MIL/uL   Hemoglobin 11.9 (*) 13.0 - 17.0 g/dL   HCT 32.7 (*) 39.0 - 52.0 %   MCV 91.6  78.0 - 100.0 fL   MCH 33.3  26.0 - 34.0 pg   MCHC 36.4 (*) 30.0 - 36.0 g/dL   RDW 13.1  11.5 - 15.5 %   Platelets 123 (*) 150 - 400 K/uL  BASIC METABOLIC PANEL     Status: Abnormal   Collection Time    12/19/13 11:01 AM      Result Value Ref Range   Sodium 126 (*) 137 - 147 mEq/L   Potassium 4.1  3.7 - 5.3 mEq/L   Chloride 90 (*) 96 - 112 mEq/L   CO2 22  19 - 32 mEq/L   Glucose, Bld 103 (*) 70 - 99 mg/dL   BUN 12  6 - 23 mg/dL   Creatinine, Ser 0.57  0.50 - 1.35 mg/dL   Calcium 8.8  8.4 - 10.5 mg/dL   GFR  calc non Af Amer >90  >90 mL/min   GFR calc Af Amer >90  >90 mL/min   Comment: (NOTE)     The eGFR has been calculated using the CKD EPI equation.     This calculation has not been validated in all clinical situations.     eGFR's persistently <90 mL/min signify possible Chronic Kidney     Disease.   Anion gap 14  5 - 15  LACTATE DEHYDROGENASE     Status: Abnormal   Collection Time    12/19/13  3:32 PM      Result Value Ref Range   LDH 1359 (*) 94 - 250 U/L  BASIC METABOLIC PANEL     Status: Abnormal   Collection Time    12/20/13 12:08 AM      Result Value Ref Range   Sodium 126 (*) 137 - 147 mEq/L   Potassium 3.9  3.7 - 5.3 mEq/L   Chloride 89 (*) 96 - 112 mEq/L   CO2 21  19 - 32 mEq/L   Glucose, Bld 104 (*) 70 - 99 mg/dL   BUN 11  6 - 23 mg/dL   Creatinine, Ser 0.53  0.50 - 1.35 mg/dL   Calcium 8.6  8.4 - 10.5 mg/dL   GFR calc non Af Amer >90  >90 mL/min   GFR calc Af Amer >90  >90 mL/min   Comment: (NOTE)     The eGFR has been calculated using the CKD EPI equation.     This calculation has not been validated in all clinical situations.     eGFR's persistently <90 mL/min signify possible Chronic Kidney     Disease.   Anion gap 16 (*) 5 - 15  CBC     Status: Abnormal   Collection  Time    12/20/13 12:08 AM      Result Value Ref Range   WBC 7.3  4.0 - 10.5 K/uL   RBC 3.52 (*) 4.22 - 5.81 MIL/uL   Hemoglobin 11.3 (*) 13.0 - 17.0 g/dL   HCT 31.7 (*) 39.0 - 52.0 %   MCV 90.1  78.0 - 100.0 fL   MCH 32.1  26.0 - 34.0 pg   MCHC 35.6  30.0 - 36.0 g/dL   RDW 13.1  11.5 - 15.5 %   Platelets 126 (*) 150 - 400 K/uL  VITAMIN B12     Status: Abnormal   Collection Time    12/20/13 12:08 AM      Result Value Ref Range   Vitamin B-12 1605 (*) 211 - 911 pg/mL   Comment: Performed at [facility]  TSH     Status: Abnormal   Collection Time    12/20/13 12:08 AM      Result Value Ref Range   TSH 6.040 (*) 0.350 - 4.500 uIU/mL  BASIC METABOLIC PANEL     Status: Abnormal    Collection Time    12/20/13 12:30 PM      Result Value Ref Range   Sodium 126 (*) 137 - 147 mEq/L   Potassium 3.9  3.7 - 5.3 mEq/L   Chloride 91 (*) 96 - 112 mEq/L   CO2 19  19 - 32 mEq/L   Glucose, Bld 96  70 - 99 mg/dL   BUN 9  6 - 23 mg/dL   Creatinine, Ser 0.43 (*) 0.50 - 1.35 mg/dL   Calcium 8.6  8.4 - 10.5 mg/dL   GFR calc non Af Amer >90  >90 mL/min   GFR calc Af Amer >90  >90 mL/min   Comment: (NOTE)     The eGFR has been calculated using the CKD EPI equation.     This calculation has not been validated in all clinical situations.     eGFR's persistently <90 mL/min signify possible Chronic Kidney     Disease.   Anion gap 16 (*) 5 - 15  PROTIME-INR     Status: None   Collection Time    12/20/13 12:30 PM      Result Value Ref Range   Prothrombin Time 13.9  11.6 - 15.2 seconds   INR 1.07  0.00 - 1.49      Past Medical History  Diagnosis Date  . Asthma   . Other and unspecified hyperlipidemia   . GERD (gastroesophageal reflux disease)   . Hypercalcemia   . Skin cancer   . Colitis 2012    Colonoscopy  . External hemorrhoids without mention of complication 6270    Colonoscopy   . Diverticulosis of colon (without mention of hemorrhage) 2008    Colonoscopy  . History of colon polyps 2008    Colonoscopy  . Hiatal hernia 2008    EGD     OTHER DATA:  . albuterol  2.5 mg Nebulization TID  . amoxicillin-clavulanate  1 tablet Oral Q12H  . budesonide-formoterol  2 puff Inhalation BID  . cholecalciferol  1,000 Units Oral q morning - 10a  . feeding supplement (ENSURE COMPLETE)  237 mL Oral TID WC  . fentaNYL      . folic acid  1 mg Oral Daily  . gelatin adsorbable      . lactulose  20 g Oral Daily  . lidocaine (PF)      . loratadine  10 mg  Oral Daily  . mesalamine  1,000 mg Rectal QHS  . midazolam      . montelukast  10 mg Oral QHS  . multivitamin with minerals  1 tablet Oral Daily  . pantoprazole  40 mg Oral Daily  . thiamine  100 mg Oral Daily   Or  .  thiamine  100 mg Intravenous Daily   MRI lumbar spine demonstrates multilevel spondylosis, among other findings   IMPRESSION:  1) metastatic process, currently being evaluated 2) hyponatremia 3) acute encephalopathy 4) spondylosis with associated lumbago  PLAN: 1) Patient is s/p liver biopsy this afternoon, and hopefully a tissue diagnosis will be available soon, and then a plan of care can be further developed. 2) hyponatremia, and other metabolic causes of encephalopathy are being addressed by primary team 2) Family reports that patient is being transferred to the cancer center at [facility].  3) I will be available as needed, and the family has my number. I will plan on following up with them in the next few days, or sooner if they call.

## 2013-12-20 NOTE — Progress Notes (Addendum)
Pt is transferring to [place] to rm 1240 on ICU stepdown side. Nurse called and gave report to nurse Fraser Din on this floor. Called and gave report to [facility] and they stated they would be coming around 730pm. Pt and pt's wife notified. Gave report to night nurse Estill Bamberg here at [place]. Pt is resting at this time. Pt's wife packed up pt's valuables from room and took home. Printed Medical Necessity report for [facility] and placed in chart. Pt had biopsy of liver this afternoon and had to lay flat for 3 hrs. Biopsy spot on left upper abdomen area. No bleeding noted. Reported to [facility] long nurse in report.

## 2013-12-20 NOTE — Procedures (Signed)
Liver Bx 18 g core No comp

## 2013-12-20 NOTE — Consult Note (Signed)
#   [id number] is formal consult note.  Lum Keas

## 2013-12-20 NOTE — Evaluation (Signed)
Physical Therapy Evaluation Patient Details Name: [name] [name] MRN: [id number] DOB: 1945-05-21 Today's Date: 12/20/2013   History of Present Illness  Patient admitted with AMS, found to be hyponatremic with CAP and positive for lung mass on CT with liver lesions as well.  Planned liver biopsy for diagnosis.  Clinical Impression  Patient presents with decreased independence with mobility due to deficits listed in PT problem list.  He will benefit from skilled PT in the acute setting to allow return home with wife assist and follow up HHPT.  Will likely need device for safety and will assess walker versus cane.    Follow Up Recommendations Home health PT;Supervision/Assistance - 24 hour    Equipment Recommendations  Other (comment) (TBA: cane versus walker )    Recommendations for Other Services       Precautions / Restrictions Precautions Precautions: Fall      Mobility  Bed Mobility Overal bed mobility: Modified Independent                Transfers Overall transfer level: Needs assistance Equipment used: None Transfers: Sit to/from Stand Sit to Stand: Min guard         General transfer comment: for safety  Ambulation/Gait Ambulation/Gait assistance: Min assist Ambulation Distance (Feet): 200 Feet Assistive device: None Gait Pattern/deviations: Step-through pattern;Wide base of support;Trunk flexed;Staggering left     General Gait Details: unsteady with ambulation without device.  Seemed due to weakness, and c/o right thigh pain with ambulation  Stairs            Wheelchair Mobility    Modified Rankin (Stroke Patients Only)       Balance Overall balance assessment: Needs assistance   Sitting balance-Leahy Scale: Good       Standing balance-Leahy Scale: Fair                               Pertinent Vitals/Pain  c/o pain with ambulation 2/10 anterior right thigh relieved with rest; Vitals: RR 30, HR 84, SpO2 91 BP 141/91  with ambulation    Home Living Family/patient expects to be discharged to:: Private residence Living Arrangements: Spouse/significant other Available Help at Discharge: Family Type of Home: House Home Access: Stairs to enter Entrance Stairs-Rails: None Technical brewer of Steps: 3 Home Layout: One level Home Equipment: None      Prior Function Level of Independence: Independent         Comments: worked as Secondary school teacher        Extremity/Trunk Assessment               Lower Extremity Assessment: Generalized weakness         Communication   Communication: No difficulties  Cognition Arousal/Alertness: Awake/alert Behavior During Therapy: WFL for tasks assessed/performed Overall Cognitive Status: Impaired/Different from baseline Area of Impairment: Orientation Orientation Level: Place             General Comments: thought was at [facility] at first, then [facility]    General Comments      Exercises        Assessment/Plan    PT Assessment Patient needs continued PT services  PT Diagnosis Abnormality of gait;Generalized weakness   PT Problem List Decreased strength;Decreased activity tolerance;Decreased balance;Decreased mobility;Decreased safety awareness;Pain  PT Treatment Interventions DME instruction;Gait training;Stair training;Balance training;Therapeutic exercise;Functional mobility training;Patient/family education   PT Goals (Current goals can be found in the  Care Plan section) Acute Rehab PT Goals Patient Stated Goal: To return home PT Goal Formulation: With patient Time For Goal Achievement: 01/03/14 Potential to Achieve Goals: Good    Frequency Min 3X/week   Barriers to discharge        Co-evaluation               End of Session Equipment Utilized During Treatment: Gait belt Activity Tolerance: Patient limited by fatigue Patient left: in chair;with call bell/phone within reach;with family/visitor  present           Time: 0902-0925 PT Time Calculation (min): 23 min   Charges:   PT Evaluation $Initial PT Evaluation Tier I: 1 Procedure PT Treatments $Gait Training: 8-22 mins   PT G Codes:          WYNN,CYNDI 12-21-2013, 9:30 AM Magda Kiel, [place] 12/21/13

## 2013-12-20 NOTE — Progress Notes (Signed)
Pt went down to MRI and had pain Po medication per md orders this am. Pt was sleeping prior to transporter coming to get pt. Pt got down to MRI and complained of severe pain in back and was moving too much for MRI staff to do exam. Called and received order to give dose of dilaudid iv pain medication. Went to MRI and gave. Pt had MRI. Back to floor now and has been sleeping since.

## 2013-12-20 NOTE — Progress Notes (Signed)
Patient transferred via [facility] to [facility] accompanied by his daughter

## 2013-12-20 NOTE — Consult Note (Cosign Needed)
NAME:  MARIO, CORONADO NO.:  [id number]  MEDICAL RECORD NO.:  [id number]  LOCATION:  2C18C                        FACILITY:  [facility]  PHYSICIAN:  Volanda Napoleon, M.D.  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  HISTORY OF PRESENT ILLNESS:  Mr. Forest Gleason is a very nice 68 year old gentleman.  He really has been in decent health from what his family says.  His wife and daughter were with him.  He actually was in [place] for trip with his daughter just a couple weeks ago.  He seemed to be doing very well. He then began to decline.  Over the past week, he has had more confusion.  He had weakness.  He has had increasing lower back pain. He had an MRI of his back which showed a significant spinal stenosis.  He continued to have issues with weakness.  He subsequently went to the emergency room.  He had a CT angiogram done, this was done on December 15, 2013.  CT angiogram showed no evidence of pulmonary embolism.  However, he had a 3-cm, left lower lobe opacity.  He had bulky mediastinal lymphadenopathy.  He had a 19-mm left lower lobe nodule.  He had numerous liver lesions.  Bone windows looked okay.  His lab work when he came in showed he was hyponatremic.  His renal function was okay.  His calcium was normal.  He had a CT scan of the head.  This showed some left maxillary sinusitis.  There is some chronic ischemic changes that were noted.  He has been seen by Critical Care.  They were asked to see him for the weakness.  They felt that he likely had lung cancer.  We were asked to see him to see how we could get a diagnosis.  Of note, he does have a history of alcohol abuse.  There was some concern regarding possibility of some withdrawal.  When I saw him, he was  pleasant.  He had little bit of confusion.  He tried to answer questions as best as he could.  He was having the back issues.  Again, he has spinal stenosis in the lumbar spine.  Of note, with one of the tests that were run, there was a mention of a thrombus in the IVC which was felt to be a tumor thrombus.  He has lost about 15 pounds.  His appetite is down.  He has had no nausea or vomiting.  He has had no fever.  He has had no bleeding.  He has had no diarrhea.  Overall, his performance status is ECOG 1-2.  PAST MEDICAL HISTORY:  Remarkable for: 1. Hyperlipidemia. 2. Asthma. 3. GERD. 4. Diverticulosis.  ALLERGIES:  None.  MEDICATIONS:  Albuterol inhaler 2 puffs q.6 hours p.r.n., Symbicort inhaler 2 puffs b.i.d., Zyrtec 10 mg p.o. daily, Flonase nasal spray 2 sprays, both nostrils as needed, Vicodin (5/325) 1 p.o. q.6 hours p.r.n. pain,  mesalamine 1000 mg rectally at bedtime, Robaxin 750 mg p.o. q.8 hours  p.r.n., Singulair 10 mg p.o. at bedtime, AcipHex 20 mg p.o. daily, Ultram 50 mg p.o. q.8 hours p.r.n., Ambien 5 mg p.o. at bedtime.  SOCIAL HISTORY:  Remarkable for tobacco use.  He stopped smoking back in 1992.  He does have significant alcohol use.  FAMILY HISTORY:  Remarkable for coronary artery disease and Alzheimer's.  PHYSICAL EXAMINATION:  GENERAL:  This is a fairly well-developed, well- nourished white gentleman in no obvious distress. VITAL SIGNS:  Temperature of 97.5, pulse 90, respiratory rate 16, blood pressure 126/78. HEAD AND NECK EXAM:  Shows no ocular or oral lesions.  There are no palpable cervical or supraclavicular lymph nodes. LUNGS:  Clear bilaterally. CARDIAC:  Regular rate and rhythm with no murmurs, rubs, or bruits. ABDOMEN:  Soft.  He has good bowel sounds.  There is no fluid wave. There is no palpable abdominal mass.  There is no palpable liver or spleen tip. BACK:  Shows no tenderness over the spine, ribs, or hips.  EXTREMITIES: Show no  clubbing, cyanosis, or edema.  NEUROLOGICAL:  Shows no focal neurological deficits.  LABORATORY STUDIES:  White cell count 6.1, hemoglobin 11.9, hematocrit 32.7, platelet count 123.  Preop LDH is 1359.  Sodium 126, potassium 4.1, BUN 12, creatinine 0.57.  His TSH is 6.  Total bilirubin 1.9. Albumin is 2.9 with a calcium of 8.7.  Alkaline phosphatase is 542. SGPT 150, SGOT 141.  IMPRESSION:  Mr. Forest Gleason is a 68 year old gentleman with metastatic bronchogenic carcinoma.  I have to believe that this will be small cell lung cancer.  The hyponatremia, the neurological issues, the bulky mediastinal adenopathy, all go along with a small-cell lung cancer.  The only possible concern is that he really is not a heavy smoker at least from what the chart says.  He has not smoked for 20 years.  As such, I suppose this might be some other malignancy.  With an LDH so high, a lymphoma is always a possibility.  I think that a liver biopsy will be the easiest way to make a diagnosis. I think that he certainly has significant disease in his liver.  I think a biopsy would be high yield.  I spent a good hour with he and his wife and daughter.  They are all very nice.  I answered all of their questions.  I told him that it was hard to give any kind of prognosis for right now, since we do not know the exact tumor type that we are dealing with.  If we can get a biopsy on him early in the week, and we should have results back.  An MRI of the head probably will be helpful.  This may give [place] more information about the brain.  He certainly could have metastasis that Might not have been picked up on the CT scan.  He also could have a neurological paraneoplastic process.  This is seen with small cell lung cancer.  We will follow along very closely.  We will provide any further input as more information comes back.     Volanda Napoleon, M.D.     PRE/MEDQ  D:  12/20/2013  T:  12/20/2013  Job:   [id number]

## 2013-12-20 NOTE — H&P (Signed)
Referring Physician: Dr. Marin Olp HPI: [name] [name] is an 68 y.o. male who presented with shortness of breath and weakness. Imaging revealed lung mass with adenopathy, liver lesions, patient with tobacco abuse and weight loss with decrease of appetite. IR received request for biopsy. Images reviewed by Dr. Barbie Banner and liver lesion biopsy scheduled today. I have spoke with the patient's wife who would like to proceed with biopsy. The patient denies any active pain at this time. He denies any known complications to sedation. The patient had sedation today 1 mg dilaudid for a MRI for confusion. He denies any chest pain. He denies any active signs of bleeding or excessive bruising.   Past Medical History:  Past Medical History  Diagnosis Date  . Asthma   . Other and unspecified hyperlipidemia   . GERD (gastroesophageal reflux disease)   . Hypercalcemia   . Skin cancer   . Colitis 2012    Colonoscopy  . External hemorrhoids without mention of complication 2353    Colonoscopy   . Diverticulosis of colon (without mention of hemorrhage) 2008    Colonoscopy  . History of colon polyps 2008    Colonoscopy  . Hiatal hernia 2008    EGD     Past Surgical History:  Past Surgical History  Procedure Laterality Date  . Rectal surgery  2004    fistula repair  . Vasectomy    . Inguinal hernia repair      Family History:  Family History  Problem Relation Age of Onset  . Heart disease Father   . Alzheimer's disease Mother     Social History:  reports that he quit smoking about 23 years ago. His smoking use included Cigarettes. He has a 37.5 pack-year smoking history. He has never used smokeless tobacco. He reports that he drinks about 3 ounces of alcohol per week. He reports that he does not use illicit drugs.  Allergies: No Known Allergies  Medications:   Medication List    ASK your doctor about these medications       albuterol 108 (90 BASE) MCG/ACT inhaler  Commonly known as:   PROVENTIL HFA;VENTOLIN HFA  Inhale 2 puffs into the lungs every 6 (six) hours as needed for wheezing or shortness of breath.     aspirin 81 MG tablet  Take 81 mg by mouth daily.     budesonide-formoterol 160-4.5 MCG/ACT inhaler  Commonly known as:  SYMBICORT  Inhale 2 puffs into the lungs 2 (two) times daily.     cetirizine 10 MG tablet  Commonly known as:  ZYRTEC  Take 10 mg by mouth daily.     diphenoxylate-atropine 2.5-0.025 MG per tablet  Commonly known as:  LOMOTIL  Take 1 tablet by mouth 4 (four) times daily as needed for diarrhea or loose stools.     fluticasone 50 MCG/ACT nasal spray  Commonly known as:  FLONASE  Place 2 sprays into both nostrils daily as needed for allergies or rhinitis.     guaiFENesin 600 MG 12 hr tablet  Commonly known as:  MUCINEX  Take 1,200 mg by mouth 2 (two) times daily as needed.     HYDROcodone-acetaminophen 5-325 MG per tablet  Commonly known as:  NORCO/VICODIN  Take 1 tablet by mouth every 6 (six) hours as needed for moderate pain.     mesalamine 1000 MG suppository  Commonly known as:  CANASA  Place 1,000 mg rectally at bedtime.     methocarbamol 750 MG tablet  Commonly known as:  ROBAXIN  Take 1 tablet (750 mg total) by mouth every 8 (eight) hours as needed for muscle spasms.     montelukast 10 MG tablet  Commonly known as:  SINGULAIR  Take 10 mg by mouth at bedtime.     predniSONE 10 MG tablet  Commonly known as:  DELTASONE  Take 6 tabs daily x 1 day; 5 tabs daily x 1 day, 4 tabs x 1 day, 3 tabs daily x 1 day, 2 tab x 1 day, then 1 tab x 1 day, then stop     RABEprazole 20 MG tablet  Commonly known as:  ACIPHEX  Take 20 mg by mouth daily.     traMADol 50 MG tablet  Commonly known as:  ULTRAM  Take 50 mg by mouth every 8 (eight) hours as needed for moderate pain.     VITAMIN D-3 PO  Take 1 capsule by mouth daily.     zolpidem 5 MG tablet  Commonly known as:  AMBIEN  Take 5 mg by mouth at bedtime.       Please HPI  for pertinent positives, otherwise complete 10 system ROS negative.  Physical Exam: BP 136/94  Pulse 83  Temp(Src) 98.1 F (36.7 C) (Oral)  Resp 15  Ht 6' (1.829 m)  Wt 163 lb 12.8 oz (74.3 kg)  BMI 22.21 kg/m2  SpO2 94% Body mass index is 22.21 kg/(m^2).  General Appearance:  Alert, NAD  Head:  Normocephalic, without obvious abnormality, atraumatic  Neck: Supple, symmetrical, trachea midline  Lungs:   Clear to auscultation bilaterally, no w/r/r, respirations unlabored without use of accessory muscles.  Chest Wall:  No tenderness or deformity  Heart:  Regular rate and rhythm, S1, S2 normal, no murmur, rub or gallop.  Abdomen:   Soft, non-tender, non distended, (+) BS  Extremities: Extremities normal, atraumatic, no cyanosis or edema  Neurologic: Normal affect, no gross deficits.   Results for orders placed during the hospital encounter of 12/15/13 (from the past 48 hour(s))  BASIC METABOLIC PANEL     Status: Abnormal   Collection Time    12/18/13  6:54 PM      Result Value Ref Range   Sodium 123 (*) 137 - 147 mEq/L   Potassium 3.8  3.7 - 5.3 mEq/L   Chloride 86 (*) 96 - 112 mEq/L   CO2 20  19 - 32 mEq/L   Glucose, Bld 111 (*) 70 - 99 mg/dL   BUN 10  6 - 23 mg/dL   Creatinine, Ser 0.53  0.50 - 1.35 mg/dL   Calcium 8.9  8.4 - 10.5 mg/dL   GFR calc non Af Amer >90  >90 mL/min   GFR calc Af Amer >90  >90 mL/min   Comment: (NOTE)     The eGFR has been calculated using the CKD EPI equation.     This calculation has not been validated in all clinical situations.     eGFR's persistently <90 mL/min signify possible Chronic Kidney     Disease.   Anion gap 17 (*) 5 - 15  BASIC METABOLIC PANEL     Status: Abnormal   Collection Time    12/19/13  3:50 AM      Result Value Ref Range   Sodium 120 (*) 137 - 147 mEq/L   Comment: CRITICAL RESULT CALLED TO, READ BACK BY AND VERIFIED WITH:     OUSTERMAN R,RN 12/19/13 0508 WAYK   Potassium 4.0  3.7 - 5.3 mEq/L   Chloride  84 (*) 96 - 112  mEq/L   CO2 20  19 - 32 mEq/L   Glucose, Bld 108 (*) 70 - 99 mg/dL   BUN 12  6 - 23 mg/dL   Creatinine, Ser 0.55  0.50 - 1.35 mg/dL   Calcium 8.8  8.4 - 10.5 mg/dL   GFR calc non Af Amer >90  >90 mL/min   GFR calc Af Amer >90  >90 mL/min   Comment: (NOTE)     The eGFR has been calculated using the CKD EPI equation.     This calculation has not been validated in all clinical situations.     eGFR's persistently <90 mL/min signify possible Chronic Kidney     Disease.   Anion gap 16 (*) 5 - 15  CBC     Status: Abnormal   Collection Time    12/19/13  3:50 AM      Result Value Ref Range   WBC 6.1  4.0 - 10.5 K/uL   RBC 3.57 (*) 4.22 - 5.81 MIL/uL   Hemoglobin 11.9 (*) 13.0 - 17.0 g/dL   HCT 32.7 (*) 39.0 - 52.0 %   MCV 91.6  78.0 - 100.0 fL   MCH 33.3  26.0 - 34.0 pg   MCHC 36.4 (*) 30.0 - 36.0 g/dL   RDW 13.1  11.5 - 15.5 %   Platelets 123 (*) 150 - 400 K/uL  BASIC METABOLIC PANEL     Status: Abnormal   Collection Time    12/19/13 11:01 AM      Result Value Ref Range   Sodium 126 (*) 137 - 147 mEq/L   Potassium 4.1  3.7 - 5.3 mEq/L   Chloride 90 (*) 96 - 112 mEq/L   CO2 22  19 - 32 mEq/L   Glucose, Bld 103 (*) 70 - 99 mg/dL   BUN 12  6 - 23 mg/dL   Creatinine, Ser 0.57  0.50 - 1.35 mg/dL   Calcium 8.8  8.4 - 10.5 mg/dL   GFR calc non Af Amer >90  >90 mL/min   GFR calc Af Amer >90  >90 mL/min   Comment: (NOTE)     The eGFR has been calculated using the CKD EPI equation.     This calculation has not been validated in all clinical situations.     eGFR's persistently <90 mL/min signify possible Chronic Kidney     Disease.   Anion gap 14  5 - 15  LACTATE DEHYDROGENASE     Status: Abnormal   Collection Time    12/19/13  3:32 PM      Result Value Ref Range   LDH 1359 (*) 94 - 250 U/L  BASIC METABOLIC PANEL     Status: Abnormal   Collection Time    12/20/13 12:08 AM      Result Value Ref Range   Sodium 126 (*) 137 - 147 mEq/L   Potassium 3.9  3.7 - 5.3 mEq/L   Chloride 89  (*) 96 - 112 mEq/L   CO2 21  19 - 32 mEq/L   Glucose, Bld 104 (*) 70 - 99 mg/dL   BUN 11  6 - 23 mg/dL   Creatinine, Ser 0.53  0.50 - 1.35 mg/dL   Calcium 8.6  8.4 - 10.5 mg/dL   GFR calc non Af Amer >90  >90 mL/min   GFR calc Af Amer >90  >90 mL/min   Comment: (NOTE)     The eGFR has been calculated  using the CKD EPI equation.     This calculation has not been validated in all clinical situations.     eGFR's persistently <90 mL/min signify possible Chronic Kidney     Disease.   Anion gap 16 (*) 5 - 15  CBC     Status: Abnormal   Collection Time    12/20/13 12:08 AM      Result Value Ref Range   WBC 7.3  4.0 - 10.5 K/uL   RBC 3.52 (*) 4.22 - 5.81 MIL/uL   Hemoglobin 11.3 (*) 13.0 - 17.0 g/dL   HCT 31.7 (*) 39.0 - 52.0 %   MCV 90.1  78.0 - 100.0 fL   MCH 32.1  26.0 - 34.0 pg   MCHC 35.6  30.0 - 36.0 g/dL   RDW 13.1  11.5 - 15.5 %   Platelets 126 (*) 150 - 400 K/uL  VITAMIN B12     Status: Abnormal   Collection Time    12/20/13 12:08 AM      Result Value Ref Range   Vitamin B-12 1605 (*) 211 - 911 pg/mL   Comment: Performed at [facility]  TSH     Status: Abnormal   Collection Time    12/20/13 12:08 AM      Result Value Ref Range   TSH 6.040 (*) 0.350 - 4.500 uIU/mL  PROTIME-INR     Status: None   Collection Time    12/20/13 12:30 PM      Result Value Ref Range   Prothrombin Time 13.9  11.6 - 15.2 seconds   INR 1.07  0.00 - 1.49   Mr Jeri Cos Wo Contrast  12/20/2013   CLINICAL DATA:  Left lower lobe lung mass.  Confusion and weakness.  EXAM: MRI HEAD WITHOUT AND WITH CONTRAST  TECHNIQUE: Multiplanar, multiecho pulse sequences of the brain and surrounding structures were obtained without and with intravenous contrast.  CONTRAST:  40m MULTIHANCE GADOBENATE DIMEGLUMINE 529 MG/ML IV SOLN  COMPARISON:  CT head without contrast 12/17/2008  FINDINGS: Numerous bilateral cerebral and cerebellar ring-enhancing lesions are present bilaterally. These demonstrate restricted  diffusion. There is relatively minimal surrounding vasogenic edema associated with the lesions. T2 signal is noted along the rim of the lesions. There are several lesions posterior to the atrium of the left lateral ventricle in the left parietal and occipital lobe white matter. A left parietal lesion measures 7 mm. No individual lesions are greater than 10 mm.  Diffusion signal abnormality is associated with the dura as well. There is some heterogeneous enhancement of the dura suggesting metastatic disease.  Minimal periventricular white matter changes are noted otherwise.  Flow is present in the major intracranial arteries. The globes and orbits are intact. A fluid level is present in the left maxillary sinus.  There is no hemorrhage associated with any of the lesions.  IMPRESSION: 1. Numerous bilateral cerebral and cerebellar sub cm peripherally enhancing lesions bilaterally compatible with metastatic disease. 2. Restricted diffusion is associated with nearly all of the lesions. This suggests a high-grade tumor and probably a small cell lung cancer given the chest findings. Restricted diffusion could also be seen with infection, but a much greater degree of vasogenic edema would be expected and patient would likely present with profound mental status changes given the extent of disease. 3. 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 These results were called by telephone at the time of interpretation on 12/20/2013 at 12:33 pm to Dr. RTyrell Antonio who verbally acknowledged these  results.   Electronically Signed   By: Lawrence Santiago M.D.   On: 12/20/2013 12:35   US Abdomen Limited Ruq  12/19/2013   CLINICAL DATA:  68 year old male with elevated LFTs.  EXAM: US ABDOMEN LIMITED - RIGHT UPPER QUADRANT  COMPARISON:  History of lung mass.  FINDINGS: Gallbladder:  Gallbladder sludge and gallbladder polyps are noted. No definite cholelithiasis or evidence of acute cholecystitis identified.  Common bile duct:  Diameter: 4 mm.  There is no evidence of intrahepatic or extrahepatic biliary dilatation.  Liver:  The liver is heterogeneous with the suggestion of multiple hepatic masses. The largest apparent mass measures 2.3 x 1.9 x 3.1 cm in the left lobe. A slightly irregular ectatic contour were may represent cirrhosis.  A 1 x 1.8 cm hyperechoic structure within the IVC at the level of the upper liver could represent bland or tumor thrombus.  IMPRESSION: Heterogeneous liver with suggestion of multiple hepatic masses and possible cirrhosis. CT or MRI with contrast is recommended for further evaluation.  1 x 1.8 cm apparent filling defect within the IVC at the level of the upper liver, question bland or tumor thrombus.  Gallbladder sludge and polyps.   Electronically Signed   By: Hassan Rowan M.D.   On: 12/19/2013 08:52    Assessment/Plan Lung mass with adenopathy.  Liver lesions Encephalopathy, MRI pending Shortness of breath Tobacco abuse Weight loss  Request for biopsy Images reviewed by Dr. Barbie Banner, scheduled today for ultrasound guided liver lesion biopsy Risks and Benefits discussed with the patient's wife. All questions were answered, patient is agreeable to proceed. Consent signed and in chart.   Tsosie Billing D PA-C 12/20/2013, 1:30 PM

## 2013-12-20 NOTE — Progress Notes (Signed)
Name: [name] [name] MRN: [id number] DOB: [birth year]    ADMISSION DATE:  12/15/2013 CONSULTATION DATE:  12/15/2013  REFERRING MD :  EDP  CHIEF COMPLAINT:  Weakness.   BRIEF PATIENT DESCRIPTION:   68 yo male former smoker and hx of ETOH was sent to ED from PCP office to evaluate weakness, dyspnea, weight loss and abnormal ECG.  He had CT chest which was concerning for metastatic lung cancer.  He is followed by Dr. Gwenette Greet in pulmonary office for asthma.  SIGNIFICANT EVENTS: 8/05 Admit 8/07 PCCM reconsulted to assess mental status  STUDIES:  8/5 CTA chest >> extensive CAD, 2.3 cm paratracheal LAN, 2.3 cm subcarinal LAN, possible Rt supraclavicular LAN, RLL consolidation, 3 cm density LLL, 1.9 cm nodule LLL, mucoid impaction RLL, numerous nodules in liver, ?Lt VC paralysis  SUBJECTIVE:  Sleepy.  VITAL SIGNS: Temp:  [97.6 F (36.4 C)-98.5 F (36.9 C)] 98.1 F (36.7 C) (08/10 1159) Pulse Rate:  [80-91] 83 (08/10 1159) Resp:  [15-30] 15 (08/10 1159) BP: (126-159)/(78-98) 136/94 mmHg (08/10 1159) SpO2:  [91 %-98 %] 94 % (08/10 1159)  PHYSICAL EXAMINATION: General: no distress Neuro: sleepy, mumbles with stimulation, moves extremities HEENT: pupils reactive Cardiovascular: regular Lungs: scattered rhonchi Abdomen: soft, non tender Musculoskeletal: no edema Skin: no rashes  CBC Recent Labs     12/18/13  0830  12/19/13  0350  12/20/13  0008  WBC  6.5  6.1  7.3  HGB  12.1*  11.9*  11.3*  HCT  32.9*  32.7*  31.7*  PLT  127*  123*  126*   BMET Recent Labs     12/19/13  1101  12/20/13  0008  12/20/13  1230  NA  126*  126*  126*  K  4.1  3.9  3.9  CL  90*  89*  91*  CO2  22  21  19   BUN  12  11  9   CREATININE  0.57  0.53  0.43*  GLUCOSE  103*  104*  96    Electrolytes Recent Labs     12/19/13  1101  12/20/13  0008  12/20/13  1230  CALCIUM  8.8  8.6  8.6   Cardiac Enzymes No results found for this basename: TROPONINI, PROBNP,  in the last 72  hours  Glucose No results found for this basename: GLUCAP,  in the last 72 hours  Imaging Mr Jeri Cos Wo Contrast  12/20/2013   CLINICAL DATA:  Left lower lobe lung mass.  Confusion and weakness.  EXAM: MRI HEAD WITHOUT AND WITH CONTRAST  TECHNIQUE: Multiplanar, multiecho pulse sequences of the brain and surrounding structures were obtained without and with intravenous contrast.  CONTRAST:  14mL MULTIHANCE GADOBENATE DIMEGLUMINE 529 MG/ML IV SOLN  COMPARISON:  CT head without contrast 12/17/2008  FINDINGS: Numerous bilateral cerebral and cerebellar ring-enhancing lesions are present bilaterally. These demonstrate restricted diffusion. There is relatively minimal surrounding vasogenic edema associated with the lesions. T2 signal is noted along the rim of the lesions. There are several lesions posterior to the atrium of the left lateral ventricle in the left parietal and occipital lobe white matter. A left parietal lesion measures 7 mm. No individual lesions are greater than 10 mm.  Diffusion signal abnormality is associated with the dura as well. There is some heterogeneous enhancement of the dura suggesting metastatic disease.  Minimal periventricular white matter changes are noted otherwise.  Flow is present in the major intracranial arteries. The globes and orbits are intact.  A fluid level is present in the left maxillary sinus.  There is no hemorrhage associated with any of the lesions.  IMPRESSION: 1. Numerous bilateral cerebral and cerebellar sub cm peripherally enhancing lesions bilaterally compatible with metastatic disease. 2. Restricted diffusion is associated with nearly all of the lesions. This suggests a high-grade tumor and probably a small cell lung cancer given the chest findings. Restricted diffusion could also be seen with infection, but a much greater degree of vasogenic edema would be expected and patient would likely present with profound mental status changes given the extent of disease.  3. 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 These results were called by telephone at the time of interpretation on 12/20/2013 at 12:33 pm to Dr. Tyrell Antonio, who verbally acknowledged these results.   Electronically Signed   By: Lawrence Santiago M.D.   On: 12/20/2013 12:35   US Abdomen Limited Ruq  12/19/2013   CLINICAL DATA:  68 year old male with elevated LFTs.  EXAM: US ABDOMEN LIMITED - RIGHT UPPER QUADRANT  COMPARISON:  History of lung mass.  FINDINGS: Gallbladder:  Gallbladder sludge and gallbladder polyps are noted. No definite cholelithiasis or evidence of acute cholecystitis identified.  Common bile duct:  Diameter: 4 mm. There is no evidence of intrahepatic or extrahepatic biliary dilatation.  Liver:  The liver is heterogeneous with the suggestion of multiple hepatic masses. The largest apparent mass measures 2.3 x 1.9 x 3.1 cm in the left lobe. A slightly irregular ectatic contour were may represent cirrhosis.  A 1 x 1.8 cm hyperechoic structure within the IVC at the level of the upper liver could represent bland or tumor thrombus.  IMPRESSION: Heterogeneous liver with suggestion of multiple hepatic masses and possible cirrhosis. CT or MRI with contrast is recommended for further evaluation.  1 x 1.8 cm apparent filling defect within the IVC at the level of the upper liver, question bland or tumor thrombus.  Gallbladder sludge and polyps.   Electronically Signed   By: Hassan Rowan M.D.   On: 12/19/2013 08:52      ASSESSMENT / PLAN:  68 yo male with hx of smoking now with weight loss, failure to thrive, hyponatremia, and findings on CT chest concerning for primary lung cancer (main concern for SCLC).  Probable lung cancer: concern for metastatic small cell. 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 H/o asthma Hyponatremia >> concern for paraneoplastic syndrome with SIADH. Alcohol abuse with concern for ETOH withdrawal.  recs For liver bx  today augmentin per primary service BD's per primary team Continue singulair Transition back to symbicort when more stable Bronchial hygiene Cont CIWA  Continue thiamine, folic acid We will s/o Call PRN    Attending:  I have seen and examined the patient with nurse practitioner/resident and agree with the note above.   PCCM to sign off  Roselie Awkward, MD [place] [facility] Pager: [phone] Cell: [phone] If no response, call [phone]

## 2013-12-21 ENCOUNTER — Inpatient Hospital Stay (HOSPITAL_COMMUNITY): Payer: BC Managed Care – PPO

## 2013-12-21 DIAGNOSIS — R630 Anorexia: Secondary | ICD-10-CM

## 2013-12-21 DIAGNOSIS — C7949 Secondary malignant neoplasm of other parts of nervous system: Secondary | ICD-10-CM

## 2013-12-21 DIAGNOSIS — C7931 Secondary malignant neoplasm of brain: Secondary | ICD-10-CM

## 2013-12-21 LAB — BASIC METABOLIC PANEL
Anion gap: 15 (ref 5–15)
BUN: 11 mg/dL (ref 6–23)
CO2: 21 mEq/L (ref 19–32)
CREATININE: 0.51 mg/dL (ref 0.50–1.35)
Calcium: 9.2 mg/dL (ref 8.4–10.5)
Chloride: 91 mEq/L — ABNORMAL LOW (ref 96–112)
GFR calc Af Amer: >90 mL/min (ref >90)
GFR calc non Af Amer: >90 mL/min (ref >90)
GLUCOSE: 121 mg/dL — AB (ref 70–99)
Potassium: 3.9 mEq/L (ref 3.7–5.3)
SODIUM: 127 meq/L — AB (ref 137–147)

## 2013-12-21 MED ORDER — ENOXAPARIN SODIUM 40 MG/0.4ML ~~LOC~~ SOLN
40.0000 mg | SUBCUTANEOUS | Status: DC
  Administered 2013-12-21 – 2013-12-24 (×4): 40 mg via SUBCUTANEOUS
  Filled 2013-12-21 (×5): qty 0.4

## 2013-12-21 MED ORDER — MIDAZOLAM HCL 2 MG/2ML IJ SOLN
INTRAMUSCULAR | Status: AC
  Filled 2013-12-21: qty 6

## 2013-12-21 MED ORDER — NYSTATIN 100000 UNIT/ML MT SUSP
10.0000 mL | Freq: Four times a day (QID) | OROMUCOSAL | Status: DC
  Administered 2013-12-21 – 2013-12-25 (×13): 1000000 [IU] via ORAL
  Filled 2013-12-21 (×19): qty 10

## 2013-12-21 MED ORDER — CEFAZOLIN SODIUM-DEXTROSE 2-3 GM-% IV SOLR
2.0000 g | INTRAVENOUS | Status: AC
  Administered 2013-12-21: 2 g via INTRAVENOUS
  Filled 2013-12-21: qty 50

## 2013-12-21 MED ORDER — ENOXAPARIN SODIUM 40 MG/0.4ML ~~LOC~~ SOLN: 40.0000 mg | SUBCUTANEOUS | Status: DC

## 2013-12-21 MED ORDER — LIDOCAINE HCL 1 % IJ SOLN
INTRAMUSCULAR | Status: AC
  Filled 2013-12-21: qty 20

## 2013-12-21 MED ORDER — LIDOCAINE-EPINEPHRINE (PF) 2 %-1:200000 IJ SOLN
INTRAMUSCULAR | Status: AC
  Filled 2013-12-21: qty 20

## 2013-12-21 MED ORDER — FENTANYL CITRATE 0.05 MG/ML IJ SOLN
INTRAMUSCULAR | Status: AC
  Filled 2013-12-21: qty 6

## 2013-12-21 MED ORDER — FENTANYL CITRATE 0.05 MG/ML IJ SOLN
INTRAMUSCULAR | Status: AC | PRN
  Administered 2013-12-21: 50 ug via INTRAVENOUS

## 2013-12-21 MED ORDER — HYDROMORPHONE HCL 2 MG PO TABS
2.0000 mg | ORAL_TABLET | ORAL | Status: DC | PRN
  Administered 2013-12-21: 2 mg via ORAL
  Filled 2013-12-21: qty 1

## 2013-12-21 MED ORDER — HYDROMORPHONE HCL 2 MG PO TABS
2.0000 mg | ORAL_TABLET | ORAL | Status: DC | PRN
  Administered 2013-12-21 – 2013-12-24 (×11): 2 mg via ORAL
  Filled 2013-12-21 (×11): qty 1

## 2013-12-21 MED ORDER — MIDAZOLAM HCL 2 MG/2ML IJ SOLN
INTRAMUSCULAR | Status: AC | PRN
  Administered 2013-12-21: 1 mg via INTRAVENOUS
  Administered 2013-12-21: 0.5 mg via INTRAVENOUS

## 2013-12-21 MED ORDER — HEPARIN SOD (PORK) LOCK FLUSH 100 UNIT/ML IV SOLN
INTRAVENOUS | Status: AC
  Filled 2013-12-21: qty 5

## 2013-12-21 MED ORDER — TRAZODONE HCL 100 MG PO TABS
100.0000 mg | ORAL_TABLET | Freq: Every day | ORAL | Status: DC
  Administered 2013-12-21 – 2013-12-24 (×4): 100 mg via ORAL
  Filled 2013-12-21: qty 2
  Filled 2013-12-21 (×4): qty 1

## 2013-12-21 MED ORDER — DEXAMETHASONE 6 MG PO TABS
12.0000 mg | ORAL_TABLET | Freq: Every day | ORAL | Status: DC
  Administered 2013-12-22 – 2013-12-23 (×2): 12 mg via ORAL
  Filled 2013-12-21 (×3): qty 2

## 2013-12-21 NOTE — Progress Notes (Signed)
Subjective: Pt s/p uncomplicated liver lesion bx 8/10; currently stable; request received for port a cath placement  Objective: Vital signs in last 24 hours: Temp:  [97.1 F (36.2 C)-98.7 F (37.1 C)] 98.1 F (36.7 C) (08/11 0800) Pulse Rate:  [34-93] 91 (08/11 0851) Resp:  [12-28] 13 (08/11 0851) BP: (120-162)/(82-105) 154/98 mmHg (08/11 0851) SpO2:  [82 %-100 %] 91 % (08/11 0851) Weight:  [160 lb 15 oz (73 kg)-166 lb 3.6 oz (75.4 kg)] 166 lb 3.6 oz (75.4 kg) (08/11 0400) Last BM Date: 12/20/13  Intake/Output from previous day: 08/10 0701 - 08/11 0700 In: 1180 [P.O.:30; I.V.:1150] Out: 1150 [Urine:1150] Intake/Output this shift:    Pt awake but sl drowsy; responds to questions; wife in room; chest- CTA bilat; heart- RRR; abd-soft,+BS,NT; ext- no edema  Lab Results:   Recent Labs  12/19/13 0350 12/20/13 0008  WBC 6.1 7.3  HGB 11.9* 11.3*  HCT 32.7* 31.7*  PLT 123* 126*   BMET  Recent Labs  12/20/13 0008 12/20/13 1230  NA 126* 126*  K 3.9 3.9  CL 89* 91*  CO2 21 19  GLUCOSE 104* 96  BUN 11 9  CREATININE 0.53 0.43*  CALCIUM 8.6 8.6   PT/INR  Recent Labs  12/20/13 1230  LABPROT 13.9  INR 1.07   ABG No results found for this basename: PHART, PCO2, PO2, HCO3,  in the last 72 hours  Studies/Results: Mr Jeri Cos Wo Contrast  12/20/2013   CLINICAL DATA:  Left lower lobe lung mass.  Confusion and weakness.  EXAM: MRI HEAD WITHOUT AND WITH CONTRAST  TECHNIQUE: Multiplanar, multiecho pulse sequences of the brain and surrounding structures were obtained without and with intravenous contrast.  CONTRAST:  23mL MULTIHANCE GADOBENATE DIMEGLUMINE 529 MG/ML IV SOLN  COMPARISON:  CT head without contrast 12/17/2008  FINDINGS: Numerous bilateral cerebral and cerebellar ring-enhancing lesions are present bilaterally. These demonstrate restricted diffusion. There is relatively minimal surrounding vasogenic edema associated with the lesions. T2 signal is noted along the  rim of the lesions. There are several lesions posterior to the atrium of the left lateral ventricle in the left parietal and occipital lobe white matter. A left parietal lesion measures 7 mm. No individual lesions are greater than 10 mm.  Diffusion signal abnormality is associated with the dura as well. There is some heterogeneous enhancement of the dura suggesting metastatic disease.  Minimal periventricular white matter changes are noted otherwise.  Flow is present in the major intracranial arteries. The globes and orbits are intact. A fluid level is present in the left maxillary sinus.  There is no hemorrhage associated with any of the lesions.  IMPRESSION: 1. Numerous bilateral cerebral and cerebellar sub cm peripherally enhancing lesions bilaterally compatible with metastatic disease. 2. Restricted diffusion is associated with nearly all of the lesions. This suggests a high-grade tumor and probably a small cell lung cancer given the chest findings. Restricted diffusion could also be seen with infection, but a much greater degree of vasogenic edema would be expected and patient would likely present with profound mental status changes given the extent of disease. 3. 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 These results were called by telephone at the time of interpretation on 12/20/2013 at 12:33 pm to Dr. Tyrell Antonio, who verbally acknowledged these results.   Electronically Signed   By: Lawrence Santiago M.D.   On: 12/20/2013 12:35   US Biopsy  12/20/2013   CLINICAL DATA:  Multiple liver lesions  EXAM: ULTRASOUND-GUIDED BIOPSY OF A LEFT LOBE  LIVER LESION.  CORE.  MEDICATIONS AND MEDICAL HISTORY: Versed One mg, Fentanyl 25 mcg.  Additional Medications: None.  ANESTHESIA/SEDATION: Moderate sedation time: 10 minutes  PROCEDURE: The procedure, risks, benefits, and alternatives were explained to the patient. Questions regarding the procedure were encouraged and answered. The patient understands and consents to the  procedure.  The epigastrium was prepped with Betadine in a sterile fashion, and a sterile drape was applied covering the operative field. A sterile gown and sterile gloves were used for the procedure.  Under sonographic guidance, an 17gauge guide needle was advanced into the left lobe liver lesion. Subsequently 4 18 gauge core biopsies were obtained. Gel-Foam slurry was injected into the tract with a guide needle removal. Final imaging was performed.  Patient tolerated the procedure well without complication. Vital sign monitoring by nursing staff during the procedure will continue as patient is in the special procedures unit for post procedure observation.  FINDINGS: The images document guide needle placement within the left lobe liver lesion. Post biopsy images demonstrate no hemorrhage.  IMPRESSION: Successful ultrasound-guided core biopsy of a left lobe liver lesion.   Electronically Signed   By: Maryclare Bean M.D.   On: 12/20/2013 15:01    Anti-infectives: Anti-infectives   Start     Dose/Rate Route Frequency Ordered Stop   12/18/13 1000  amoxicillin-clavulanate (AUGMENTIN) 875-125 MG per tablet 1 tablet     1 tablet Oral Every 12 hours 12/18/13 0753     12/16/13 2100  cefTRIAXone (ROCEPHIN) 1 g in dextrose 5 % 50 mL IVPB  Status:  Discontinued     1 g 100 mL/hr over 30 Minutes Intravenous Every 24 hours 12/15/13 2350 12/18/13 0753   12/16/13 2100  azithromycin (ZITHROMAX) 500 mg in dextrose 5 % 250 mL IVPB  Status:  Discontinued     500 mg 250 mL/hr over 60 Minutes Intravenous Every 24 hours 12/15/13 2350 12/18/13 0753   12/15/13 2215  azithromycin (ZITHROMAX) 500 mg in dextrose 5 % 250 mL IVPB     500 mg 250 mL/hr over 60 Minutes Intravenous  Once 12/15/13 2213 12/17/13 1947   12/15/13 2215  cefTRIAXone (ROCEPHIN) 1 g in dextrose 5 % 50 mL IVPB  Status:  Discontinued     1 g 100 mL/hr over 30 Minutes Intravenous Every 24 hours 12/15/13 2213 12/15/13 2359      Assessment/Plan: Pt with  suspected metastatic lung carcinoma, s/p liver lesion bx 8/10- path pending; now for port a cath placement today. Details/risks of procedure d/w pt/wife with their understanding and consent.  LOS: 6 days    Jamilia Jacques,D [facility] 12/21/2013

## 2013-12-21 NOTE — Progress Notes (Addendum)
Patient ID: [name] [name], male   DOB: 1946-03-02, 68 y.o.   MRN: [id number] [facility] PROGRESS NOTE  Moishe Schellenberg jr DUK:[id number] DOB: 12/17/1945 DOA: 12/15/2013 PCP: [name], MD  Brief narrative: 68 year old male admitted 8/5 with main concern of 2 weeks duration dyspnea at rest and with exertion. CTA chest done to rule out PE but was notable for 3 cm LLL mass with liver lesions and mediastinal adenopathy. RUQ abd US showed multiple liver lesions c/w cirrhosis vs IVC tumor vs thrombus. MRI brain with lesions worrisome for metastatic lung cancer to the brain. Pt was transferred from [facility] to [facility] for further management.  Assessment and Plan: Encephalopathy - Could be related to alcohol withdrawal, also neuroendocrine process from malignancy vs brain met's - appears to be resolved at this point, pt is alert and oriented x 3 at baseline mental state of functioning  - continue supportive care, dexamethasone 12 mg PO QD Lung mass 3 cm left lower lobe/ Mediastinal adenopathy, numerous liver masses and brain lesions  - Need PET Scan first to determine better approach for biopsy.  - Liver metastasis, Possible IVC tumor, thrombus. [name] consulted. Appreciate assistance  - plan for port placement this AM  Transaminitis  - from liver mets - status post liver biopsy  Alcohol Withdrawal - on CIWA, ativan, thiamine and folate.  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  - ativan per [employer], monitor in SDU today, possible transfer to telemetry bed this afternoon  Constipation - continue lactulose  Hyponatremia - Differential SIADH from #1, dehydration, alcohol related . Urine osmolality 900.  - sodium remains stable at 126  Thrombocytopenia - remains stable, no signs of active bleeding - repeat CBC in AM CAP (community acquired pneumonia)  - Received ceftriaxone and azithromycin day 3.  - Continue with  Augmentin, day 6/7 of antibiotics,  Low back pain - Due to Musculoskeletal pain versus Metastatic Disease.  - Need PET scan at some point but can only be done in an outpatient setting  - continue Dilaudid as needed Intrinsic asthma  - Stable  GERD  - Protonix   DVT prophylaxis: SCD, needs Lovenox post biopsy.   Consultants:  Pulmonary, CCM  [name], oncology  Radiology Neurosurgery - consult as needed  Antibiotics:  Ceftriaxone 8-5---8-8  Azithromycin. 8-5----8-8  Augmentin. 8-8 Procedures/Studies: Mr Jeri Cos Wo Contrast  December 26, 2013  Bilateral cerebral/cerebellar lesions c/w metastatic disease, ? high-grade tumor, ? small cell lung cancer given the chest findings, ? metastatic disease of the dura.  US Biopsy  2013/12/26   Successful ultrasound-guided core biopsy of a left lobe liver lesion.   Code Status: Full Family Communication: Pt and wife at bedside Disposition Plan: Home when medically stable  HPI/Subjective: No events overnight.   Objective: Filed Vitals:   12/21/13 0600 12/21/13 0606 12/21/13 0800 12/21/13 0851  BP: 142/98   154/98  Pulse: 82 38  91  Temp:   98.1 F (36.7 C)   TempSrc:   Oral   Resp: _0 Height:      Weight:      SpO2:  88%  91%    Intake/Output Summary (Last 24 hours) at 12/21/13 0910 Last data filed at 12/21/13 0600  Gross per 24 hour  Intake   1080 ml  Output   1150 ml  Net    -70 ml    Exam:  General:  Pt is alert, follows commands appropriately, not in acute distress  Cardiovascular: Regular rate and rhythm, S1/S2, no murmurs, no rubs, no gallops  Respiratory: Clear to auscultation bilaterally, no wheezing, diminished breath sounds at bases   Abdomen: Soft, non tender, non distended, bowel sounds present, no guardingl  Data Reviewed: Basic Metabolic Panel:  Recent Labs Lab 12/18/13 1854 12/19/13 0350 12/19/13 1101 12/20/13 0008 12/20/13 1230  NA 123* 120* 126* 126* 126*  K 3.8 4.0 4.1 3.9 3.9  CL 86*  84* 90* 89* 91*  CO2 _0 GLUCOSE 111* 108* 103* 104* 96  BUN _1 CREATININE 0.53 0.55 0.57 0.53 0.43*  CALCIUM 8.9 8.8 8.8 8.6 8.6   Liver Function Tests:  Recent Labs Lab 12/18/13 0830  AST 141*  ALT 150*  ALKPHOS 542*  BILITOT 1.9*  PROT 6.1  ALBUMIN 2.9*    Recent Labs Lab 12/18/13 0830  AMMONIA 23   CBC:  Recent Labs Lab 12/15/13 1750 12/16/13 0523 12/18/13 0830 12/19/13 0350 12/20/13 0008  WBC 5.9 5.3 6.5 6.1 7.3  HGB 13.8 12.8* 12.1* 11.9* 11.3*  HCT 37.2* 35.4* 32.9* 32.7* 31.7*  MCV 91.2 90.3 89.4 91.6 90.1  PLT 183 161 127* 123* 126*   CBG:  Recent Labs Lab 12/15/13 1830 12/16/13 1123 12/16/13 1613  GLUCAP 127* 112* 122*    Recent Results (from the past 240 hour(s))  MRSA PCR SCREENING     Status: None   Collection Time    12/17/13  5:00 PM      Result Value Ref Range Status   MRSA by PCR NEGATIVE  NEGATIVE Final   Comment:            The GeneXpert MRSA Assay (FDA     approved for NASAL specimens     only), is one component of a     comprehensive MRSA colonization     surveillance program. It is not     intended to diagnose MRSA     infection nor to guide or     monitor treatment for     MRSA infections.     Scheduled Meds: . allopurinol  100 mg Oral Daily  . amoxicillin-clavulanate  1 tablet Oral Q12H  . budesonide-formoterol  2 puff Inhalation BID  . cholecalciferol  1,000 Units Oral q morning - 10a  . dexamethasone  12 mg Oral Daily  . folic acid  1 mg Oral Daily  . lactulose  20 g Oral Daily  . loratadine  10 mg Oral Daily  . mesalamine  1,000 mg Rectal QHS  . montelukast  10 mg Oral QHS  . pantoprazole  40 mg Oral Daily  . thiamine  100 mg Oral Daily  . traZODone  100 mg Oral QHS   Continuous Infusions: . sodium chloride 1,000 mL (12/20/13 2206)   Faye Ramsay, MD  [facility] Pager [phone]  If 7PM-7AM, please contact night-coverage [email or web address] Password [email or web address] 12/21/2013, 9:10 AM   LOS:  6 days

## 2013-12-21 NOTE — Progress Notes (Signed)
CARE MANAGEMENT NOTE 12/21/2013  Patient:  [name] [name], [name] [name]   Account Number:  [id number]  Date Initiated:  12/17/2013  Documentation initiated by:  AMERSON,JULIE  Subjective/Objective Assessment:   Pt adm on 12/15/13 with SOB, progressive weakness.  Lung mass and liver lesions found on CT.  PTA, pt resides at home and is independent.  Per report, is separated from wife, who is at bedside.     Action/Plan:   Will follow for dc needs as pt progresses.  CSW consulted for ETOH counseling.   Anticipated DC Date:  12/24/2013   Anticipated DC Plan:  [place]  CM consult      Choice offered to / List presented to:             Status of service:  In process, will continue to follow Medicare Important Message given?   (If response is "NO", the following Medicare IM given date fields will be blank) Date Medicare IM given:   Medicare IM given by:   Date Additional Medicare IM given:   Additional Medicare IM given by:    Discharge Disposition:    Per UR Regulation:  Reviewed for med. necessity/level of care/duration of stay  If discussed at [place] of Stay Meetings, dates discussed:    Comments:  [id number]/Malvika Tung Eldridge Dace, BSN, CCM: [phone] Chart reviewed for discharge needs. Patient transferred to sdu at [facility] from the [place] for [facility] treatments/patient remains confused/s/p liver biospy on [id number]/question of mestatic progress to the brain and liver. Next chart review due on [id number].

## 2013-12-21 NOTE — Progress Notes (Signed)
Paged MD on call, Pt is having breakthrough pain, Dilaudid 1 mg given 22:23 & 04:56. KPAD also added.

## 2013-12-21 NOTE — Procedures (Signed)
Successful RT IJ POWER PORT TIP SVC/RA NO COMP STABLE ACCESSED AND READY TO USE

## 2013-12-21 NOTE — Progress Notes (Signed)
[name] is now over that was a long hospital. He is in the ICU.  He had his liver biopsy yesterday. The results are pending.  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 There is no cerebral edema. The metastases are small.  He still not eating much. We will try him on some Megace. I also may try a little Marinol.  I will get him on a little bit of Decadron. This is just to help with any potential CNS edema.  I have not see any labs back today.  He's had no problems with diarrhea. He's had no urinary incontinence. He does have a catheter.  He is not complaining of any pain.  He's had no fever. He has had no shortness of breath.  His physical exam shows stable vital signs. His temperature is 98.1. Pulse 86. Blood pressure 142/89. Head and neck exam shows no ocular or oral lesions. He has no palpable cervical or supraclavicular lymph nodes. Lungs are clear bilaterally. No wheezes are noted. Cardiac exam regular rate and rhythm with no murmurs rubs or bruits. Abdomen is soft. She has good bowel sounds. There is no palpable liver or spleen tip. Neck exam shows no tenderness over the spine. Extremities shows no clubbing cyanosis or edema. He has a decent strength in his legs. Skin exam no rashes. Neurological exam is non-focal.  We will await the liver biopsy. Again, I really have to think that this will be small cell lung cancer.  I spent about 40 minutes with he and his daughter today. I explained chemotherapy to them. I went over the side effects. I explained to them the risk of infection because of the white cell count going low. We would give him Neulasta after chemotherapy.  Also explained the possibility of fatigue, weight loss, diarrhea or constipation. He may have some mouth sores. He may have some joint aches or pains.  I think that the chemotherapy should help the CNS metastasis. At some point, he probably will need radiation.  We have to make sure that he gets his lab work done.  I think  he will be scheduled for his Port-A-Cath today.

## 2013-12-22 ENCOUNTER — Encounter (HOSPITAL_COMMUNITY): Payer: Self-pay | Admitting: Hematology & Oncology

## 2013-12-22 DIAGNOSIS — C3490 Malignant neoplasm of unspecified part of unspecified bronchus or lung: Secondary | ICD-10-CM

## 2013-12-22 HISTORY — DX: Malignant neoplasm of unspecified part of unspecified bronchus or lung: C34.90

## 2013-12-22 LAB — CBC
HEMATOCRIT: 28.4 % — AB (ref 39.0–52.0)
Hemoglobin: 10.3 g/dL — ABNORMAL LOW (ref 13.0–17.0)
MCH: 32.7 pg (ref 26.0–34.0)
MCHC: 36.3 g/dL — ABNORMAL HIGH (ref 30.0–36.0)
MCV: 90.2 fL (ref 78.0–100.0)
Platelets: 108 10*3/uL — ABNORMAL LOW (ref 150–400)
RBC: 3.15 MIL/uL — ABNORMAL LOW (ref 4.22–5.81)
RDW: 13.3 % (ref 11.5–15.5)
WBC: 7.7 10*3/uL (ref 4.0–10.5)

## 2013-12-22 LAB — COMPREHENSIVE METABOLIC PANEL
ALK PHOS: 716 U/L — AB (ref 39–117)
ALT: 148 U/L — ABNORMAL HIGH (ref 0–53)
ANION GAP: 15 (ref 5–15)
AST: 135 U/L — ABNORMAL HIGH (ref 0–37)
Albumin: 2.5 g/dL — ABNORMAL LOW (ref 3.5–5.2)
BILIRUBIN TOTAL: 1.6 mg/dL — AB (ref 0.3–1.2)
BUN: 11 mg/dL (ref 6–23)
CHLORIDE: 93 meq/L — AB (ref 96–112)
CO2: 19 mEq/L (ref 19–32)
Calcium: 9.1 mg/dL (ref 8.4–10.5)
Creatinine, Ser: 0.55 mg/dL (ref 0.50–1.35)
GLUCOSE: 118 mg/dL — AB (ref 70–99)
Potassium: 4.2 mEq/L (ref 3.7–5.3)
Sodium: 127 mEq/L — ABNORMAL LOW (ref 137–147)
Total Protein: 5.6 g/dL — ABNORMAL LOW (ref 6.0–8.3)

## 2013-12-22 LAB — LACTATE DEHYDROGENASE: LDH: 1194 U/L — AB (ref 94–250)

## 2013-12-22 LAB — PREALBUMIN: Prealbumin: 11.2 mg/dL — ABNORMAL LOW (ref 17.0–34.0)

## 2013-12-22 NOTE — Progress Notes (Signed)
[name] looks a little but more alert. He now is on some Decadron. Hopefully he will he better. His Port-A-Cath is in.  We still do not have the pathology report back yet. Hopefully this will come out today. Again, I have to suspect that this will be small cell lung cancer.  He's not hurting. He's had no bleeding. He's had no fever. He's had no cough.  His vital signs are all stable. Blood pressure 125/86. Temperature 98.1.  I spent one half hour talking to him, his wife and friend. I answered all their questions. There were thing about a second opinion. I told him I would have no problems with this. We really need to have the pathology results back. He will have to physically and go for the consultation. At this is small cell lung cancer, standard therapy I think would be offered. I don't see that a clinical trial would be the way to go for a him up for therapy. His liver tests are abnormal. He has brain metastases. I think these would disqualify him from a clinical trial.  We will await the path results. Once we get these, that we can finalize treatment plans.  His labs are stable. His sodium is 127.  I very much appreciate the great care and he is getting in the ICU.  [name] [name]

## 2013-12-22 NOTE — Progress Notes (Signed)
Physical Therapy Treatment Patient Details Name: Hyland Mollenkopf MRN: [id number] DOB: [birth year] Today's Date: 12/22/2013    History of Present Illness Patient admitted with AMS, found to be hyponatremic with CAP and positive for lung mass on CT with liver lesions as well.  Planned liver biopsy for diagnosis.    PT Comments    C/o R knee pain with activity-4/10. Ice applied end of session at pt's request. Tolerated activity fairly well.Dyspnea2/4 with ambulation.   Follow Up Recommendations  Home health PT;Supervision/Assistance - 24 hour     Equipment Recommendations   (to be determined-likely cane)    Recommendations for Other Services       Precautions / Restrictions Precautions Precautions: Fall Restrictions Weight Bearing Restrictions: No    Mobility  Bed Mobility Overal bed mobility: Modified Independent                Transfers Overall transfer level: Needs assistance   Transfers: Sit to/from Stand Sit to Stand: Supervision            Ambulation/Gait Ambulation/Gait assistance: Min guard Ambulation Distance (Feet): 275 Feet Assistive device:  (IV pole) Gait Pattern/deviations: Step-through pattern;Antalgic;Decreased stride length     General Gait Details: Pt reports R knee pain with ambulation-4/10. good gait speed.    Stairs            Wheelchair Mobility    Modified Rankin (Stroke Patients Only)       Balance                                    Cognition Arousal/Alertness: Awake/alert Behavior During Therapy: WFL for tasks assessed/performed Overall Cognitive Status: Within Functional Limits for tasks assessed                      Exercises General Exercises - Lower Extremity Ankle Circles/Pumps: AROM;Both;15 reps;Supine Quad Sets: AROM;Both;10 reps;Supine Hip ABduction/ADduction: AROM;Both;10 reps;Supine    General Comments        Pertinent Vitals/Pain Pain Assessment: 0-10 Pain Score: 4   Pain Location: R knee with ambulation Pain Intervention(s): Ice applied;Repositioned    Home Living                      Prior Function            PT Goals (current goals can now be found in the care plan section) Progress towards PT goals: Progressing toward goals    Frequency  Min 3X/week    PT Plan Current plan remains appropriate    Co-evaluation             End of Session Equipment Utilized During Treatment: Gait belt Activity Tolerance: Patient limited by fatigue Patient left: in chair;with call bell/phone within reach     Time: 1425-1449 PT Time Calculation (min): 24 min  Charges:  $Gait Training: 8-22 mins $Therapeutic Exercise: 8-22 mins                    G Codes:      Weston Anna, MPT Pager: [phone]

## 2013-12-22 NOTE — Progress Notes (Signed)
Pt transferred to 1425. Full report given to [place]. VSS for transport via wheelchair.

## 2013-12-22 NOTE — Progress Notes (Signed)
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 Patient is status post ultrasound-guided biopsy of liver lobe lesion. Biopsies pending. Per oncology. Continue Decadron. Abdominal ultrasound with possible IV C. tumor versus thrombus. Per oncology likely tumor and to hold off on anticoagulation at this time. Patient is status post Port-A-Cath placement. Per oncology.  #2 encephalopathy Likely secondary to brain metastases versus alcohol withdrawal. Patient with clinical improvement and close to baseline. Continue Decadron. Continue empiric antibiotics will community-acquired pneumonia.  #3 transaminitis Secondary to liver metastases. Patient is status post ultrasound-guided biopsy would results pending.  #4 community acquired pneumonia Clinical improvement. Patient currently on Augmentin day 7/8 of antibiotics. Follow.  #5 alcohol withdrawal On the Ativan CIWA protocol. Continue thiamine and folic acid. Will follow.  #6 hyponatremia Likely secondary to volume depletion versus SIADH secondary to lung mass. Urine osmolality was 900. Urine sodium was less than 20. Patient noted to have poor oral intake prior to admission. Sodium level currently at 127. Increase IV fluids 100 cc per hour and follow.  #7 constipation Change lactulose to MiraLAX twice daily.  #8 low back pain/spondylosis with associated Lumbago Continue pain management.  #9 thrombocytopenia Currently stable. No signs of bleeding. Follow.  #10 gastroesophageal reflux disease PPI.  #11 history of asthma Stable.  #12 prophylaxis SCDs for DVT prophylaxis.   Code Status: Full Family Communication: Updated patient and wife at bedside Disposition Plan: transfer to  telemetry   Consultants:  Oncology: Dr. Marin Olp 12/19/2013  PCCM : [name] 12/16/13  Neurosurgery; Dr Maryjean Ka 12/20/13  Procedures: Mr Brain W Wo Contrast 12/20/2013 Bilateral cerebral/cerebellar lesions c/w metastatic disease, ? high-grade tumor, ? small cell lung cancer given the chest findings, ? metastatic disease of the dura. US Biopsy 12/20/2013 Successful ultrasound-guided core biopsy of a left lobe liver lesion.  CT angiogram chest 12/15/2013 CT head without contrast 12/17/2013 Right upper quadrant ultrasound 12/19/2013 Chest x-ray 12/15/2013 RIJ Port-A-Cath placement 12/21/2013 per interventional radiology  Antibiotics: Ceftriaxone 8-5---8-8  Azithromycin. 8-5----8-8  Augmentin. 8-8   HPI/Subjective: Patient states she's feeling better. Patient states shortness of breath is improved. Patient alert and oriented to self and time. Patient thinks he is in [place]. Patient states ate much better today. Patient asking when he can go home.  Objective: Filed Vitals:   12/22/13 0800  BP: 129/87  Pulse: 87  Temp: 98.6 F (37 C)  Resp: 14    Intake/Output Summary (Last 24 hours) at 12/22/13 0936 Last data filed at 12/22/13 0800  Gross per 24 hour  Intake   1315 ml  Output    675 ml  Net    640 ml   Filed Weights   12/20/13 2146 12/21/13 0400 12/22/13 0500  Weight: 73.2 kg (161 lb 6 oz) 75.4 kg (166 lb 3.6 oz) 74.9 kg (165 lb 2 oz)    Exam:   General:  NAD  Cardiovascular: RRR  Respiratory: CTAB  Abdomen: Soft, nontender, nondistended, positive bowel sounds.  Musculoskeletal: No clubbing cyanosis or edema  Data Reviewed: Basic Metabolic Panel:  Recent Labs Lab 12/19/13 1101 12/20/13 0008 12/20/13 1230 12/21/13 1117 12/22/13 0340  NA 126* 126* 126* 127* 127*  K 4.1 3.9 3.9 3.9 4.2  CL 90* 89* 91* 91* 93*  CO2 22 21 19 21 19   GLUCOSE 103* 104* 96  121* 118*  BUN 12 11 9 11 11   CREATININE 0.57 0.53 0.43* 0.51 0.55  CALCIUM 8.8 8.6 8.6 9.2 9.1    Liver Function Tests:  Recent Labs Lab 12/18/13 0830 12/22/13 0340  AST 141* 135*  ALT 150* 148*  ALKPHOS 542* 716*  BILITOT 1.9* 1.6*  PROT 6.1 5.6*  ALBUMIN 2.9* 2.5*   No results found for this basename: LIPASE, AMYLASE,  in the last 168 hours  Recent Labs Lab 12/18/13 0830  AMMONIA 23   CBC:  Recent Labs Lab 12/16/13 0523 12/18/13 0830 12/19/13 0350 January 04, 2014 0008 12/22/13 0340  WBC 5.3 6.5 6.1 7.3 7.7  HGB 12.8* 12.1* 11.9* 11.3* 10.3*  HCT 35.4* 32.9* 32.7* 31.7* 28.4*  MCV 90.3 89.4 91.6 90.1 90.2  PLT 161 127* 123* 126* 108*   Cardiac Enzymes: No results found for this basename: CKTOTAL, CKMB, CKMBINDEX, TROPONINI,  in the last 168 hours BNP (last 3 results)  Recent Labs  12/15/13 1750  PROBNP 192.0*   CBG:  Recent Labs Lab 12/15/13 1830 12/16/13 1123 12/16/13 1613  GLUCAP 127* 112* 122*    Recent Results (from the past 240 hour(s))  MRSA PCR SCREENING     Status: None   Collection Time    12/17/13  5:00 PM      Result Value Ref Range Status   MRSA by PCR NEGATIVE  NEGATIVE Final   Comment:            The GeneXpert MRSA Assay (FDA     approved for NASAL specimens     only), is one component of a     comprehensive MRSA colonization     surveillance program. It is not     intended to diagnose MRSA     infection nor to guide or     monitor treatment for     MRSA infections.     Studies: Mr Kizzie Fantasia Contrast  Jan 04, 2014   CLINICAL DATA:  Left lower lobe lung mass.  Confusion and weakness.  EXAM: MRI HEAD WITHOUT AND WITH CONTRAST  TECHNIQUE: Multiplanar, multiecho pulse sequences of the brain and surrounding structures were obtained without and with intravenous contrast.  CONTRAST:  17mL MULTIHANCE GADOBENATE DIMEGLUMINE 529 MG/ML IV SOLN  COMPARISON:  CT head without contrast 12/17/2008  FINDINGS: Numerous bilateral cerebral and cerebellar ring-enhancing lesions are present bilaterally. These demonstrate restricted diffusion. There  is relatively minimal surrounding vasogenic edema associated with the lesions. T2 signal is noted along the rim of the lesions. There are several lesions posterior to the atrium of the left lateral ventricle in the left parietal and occipital lobe white matter. A left parietal lesion measures 7 mm. No individual lesions are greater than 10 mm.  Diffusion signal abnormality is associated with the dura as well. There is some heterogeneous enhancement of the dura suggesting metastatic disease.  Minimal periventricular white matter changes are noted otherwise.  Flow is present in the major intracranial arteries. The globes and orbits are intact. A fluid level is present in the left maxillary sinus.  There is no hemorrhage associated with any of the lesions.  IMPRESSION: 1. Numerous bilateral cerebral and cerebellar sub cm peripherally enhancing lesions bilaterally compatible with metastatic disease. 2. Restricted diffusion is associated with nearly all of the lesions. This suggests a high-grade tumor and probably a small cell lung cancer given the chest findings. Restricted diffusion could also be seen with infection, but a much greater degree of vasogenic edema would be expected  and patient would likely present with profound mental status changes given the extent of disease. 3. 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 These results were called by telephone at the time of interpretation on 12/20/2013 at 12:33 pm to Dr. Tyrell Antonio, who verbally acknowledged these results.   Electronically Signed   By: Lawrence Santiago M.D.   On: 12/20/2013 12:35   US Biopsy  12/20/2013   CLINICAL DATA:  Multiple liver lesions  EXAM: ULTRASOUND-GUIDED BIOPSY OF A LEFT LOBE LIVER LESION.  CORE.  MEDICATIONS AND MEDICAL HISTORY: Versed One mg, Fentanyl 25 mcg.  Additional Medications: None.  ANESTHESIA/SEDATION: Moderate sedation time: 10 minutes  PROCEDURE: The procedure, risks, benefits, and alternatives were explained to the patient.  Questions regarding the procedure were encouraged and answered. The patient understands and consents to the procedure.  The epigastrium was prepped with Betadine in a sterile fashion, and a sterile drape was applied covering the operative field. A sterile gown and sterile gloves were used for the procedure.  Under sonographic guidance, an 17gauge guide needle was advanced into the left lobe liver lesion. Subsequently 4 18 gauge core biopsies were obtained. Gel-Foam slurry was injected into the tract with a guide needle removal. Final imaging was performed.  Patient tolerated the procedure well without complication. Vital sign monitoring by nursing staff during the procedure will continue as patient is in the special procedures unit for post procedure observation.  FINDINGS: The images document guide needle placement within the left lobe liver lesion. Post biopsy images demonstrate no hemorrhage.  IMPRESSION: Successful ultrasound-guided core biopsy of a left lobe liver lesion.   Electronically Signed   By: Maryclare Bean M.D.   On: 12/20/2013 15:01   Ir Fluoro Guide Cv Line Right  12/21/2013   CLINICAL DATA:  METASTATIC LUNG CANCER  EXAM: RIGHT IJ SINGLE LUMEN POWER PORT CATHETER INSERTION  Date:  8/11/20158/03/2014 3:57 pm  Radiologist:  M. Daryll Brod, MD  Guidance:  Ultrasound fluoroscopic  FLUOROSCOPY TIME:  36 seconds  MEDICATIONS AND MEDICAL HISTORY: 2 g Ancefadministered within 1 hour of the procedure.1.5 mg Versed, 50 mcg fentanyl  ANESTHESIA/SEDATION: 30 min  CONTRAST:  None.  COMPLICATIONS: None immediate  PROCEDURE: Informed consent was obtained from the patient following explanation of the procedure, risks, benefits and alternatives. The patient understands, agrees and consents for the procedure. All questions were addressed. A time out was performed.  Maximal barrier sterile technique utilized including caps, mask, sterile gowns, sterile gloves, large sterile drape, hand hygiene, and 2% chlorhexidine  scrub.  Under sterile conditions and local anesthesia, right internal micropuncture venous access was performed. Access was performed with ultrasound. Images were obtained for documentation. A guide wire was inserted followed by a transitional dilator. This allowed insertion of a guide wire and catheter into the IVC. Measurements were obtained from the SVC / RA junction back to the right IJ venotomy site. In the right infraclavicular chest, a subcutaneous pocket was created over the second anterior rib. This was done under sterile conditions and local anesthesia. 1% lidocaine with epinephrine was utilized for this. A 2.5 cm incision was made in the skin. Blunt dissection was performed to create a subcutaneous pocket over the right pectoralis major muscle. The pocket was flushed with saline vigorously. There was adequate hemostasis. The port catheter was assembled and checked for leakage. The port catheter was secured in the pocket with two retention sutures. The tubing was tunneled subcutaneously to the Right venotomy site and inserted into the SVC/RA junction through  a valved peel-away sheath. Position was confirmed with fluoroscopy. Images were obtained for documentation. The patient tolerated the procedure well. No immediate complications. Incisions were closed in a two layer fashion with 4 - 0 Vicryl suture. Dermabond was applied to the skin. The port catheter was accessed, blood was aspirated followed by saline and heparin flushes. Needle was removed. A dry sterile dressing was applied.  IMPRESSION: Ultrasound and fluoroscopically guided right internal jugular single lumen power port catheter insertion. Tip in the SVC/RA junction. Catheter ready for use.   Electronically Signed   By: Daryll Brod M.D.   On: 12/21/2013 16:00   Ir US Guide Vasc Access Right  12/21/2013   CLINICAL DATA:  METASTATIC LUNG CANCER  EXAM: RIGHT IJ SINGLE LUMEN POWER PORT CATHETER INSERTION  Date:  8/11/20158/03/2014 3:57 pm   Radiologist:  M. Daryll Brod, MD  Guidance:  Ultrasound fluoroscopic  FLUOROSCOPY TIME:  36 seconds  MEDICATIONS AND MEDICAL HISTORY: 2 g Ancefadministered within 1 hour of the procedure.1.5 mg Versed, 50 mcg fentanyl  ANESTHESIA/SEDATION: 30 min  CONTRAST:  None.  COMPLICATIONS: None immediate  PROCEDURE: Informed consent was obtained from the patient following explanation of the procedure, risks, benefits and alternatives. The patient understands, agrees and consents for the procedure. All questions were addressed. A time out was performed.  Maximal barrier sterile technique utilized including caps, mask, sterile gowns, sterile gloves, large sterile drape, hand hygiene, and 2% chlorhexidine scrub.  Under sterile conditions and local anesthesia, right internal micropuncture venous access was performed. Access was performed with ultrasound. Images were obtained for documentation. A guide wire was inserted followed by a transitional dilator. This allowed insertion of a guide wire and catheter into the IVC. Measurements were obtained from the SVC / RA junction back to the right IJ venotomy site. In the right infraclavicular chest, a subcutaneous pocket was created over the second anterior rib. This was done under sterile conditions and local anesthesia. 1% lidocaine with epinephrine was utilized for this. A 2.5 cm incision was made in the skin. Blunt dissection was performed to create a subcutaneous pocket over the right pectoralis major muscle. The pocket was flushed with saline vigorously. There was adequate hemostasis. The port catheter was assembled and checked for leakage. The port catheter was secured in the pocket with two retention sutures. The tubing was tunneled subcutaneously to the Right venotomy site and inserted into the SVC/RA junction through a valved peel-away sheath. Position was confirmed with fluoroscopy. Images were obtained for documentation. The patient tolerated the procedure well. No  immediate complications. Incisions were closed in a two layer fashion with 4 - 0 Vicryl suture. Dermabond was applied to the skin. The port catheter was accessed, blood was aspirated followed by saline and heparin flushes. Needle was removed. A dry sterile dressing was applied.  IMPRESSION: Ultrasound and fluoroscopically guided right internal jugular single lumen power port catheter insertion. Tip in the SVC/RA junction. Catheter ready for use.   Electronically Signed   By: Daryll Brod M.D.   On: 12/21/2013 16:00    Scheduled Meds: . allopurinol  100 mg Oral Daily  . amoxicillin-clavulanate  1 tablet Oral Q12H  . budesonide-formoterol  2 puff Inhalation BID  . cholecalciferol  1,000 Units Oral q morning - 10a  . dexamethasone  12 mg Oral Daily  . enoxaparin (LOVENOX) injection  40 mg Subcutaneous Q24H  . feeding supplement (ENSURE COMPLETE)  237 mL Oral TID WC  . folic acid  1 mg Oral Daily  .  lactulose  20 g Oral Daily  . loratadine  10 mg Oral Daily  . mesalamine  1,000 mg Rectal QHS  . montelukast  10 mg Oral QHS  . multivitamin with minerals  1 tablet Oral Daily  . nystatin  10 mL Oral QID  . pantoprazole  40 mg Oral Daily  . thiamine  100 mg Oral Daily   Or  . thiamine  100 mg Intravenous Daily  . traZODone  100 mg Oral QHS   Continuous Infusions: . sodium chloride 100 mL/hr at 12/22/13 3007    Principal Problem:   Lung mass Active Problems:   CAP (community acquired pneumonia)   Hyponatremia   Acute encephalopathy   HYPERLIPIDEMIA   Intrinsic asthma   GERD   Mediastinal adenopathy   Low back pain   Weight loss    Time spent: 40 mins    [facility] MD [facility] Pager [phone]. If 7PM-7AM, please contact night-coverage at [email or web address], password [place] [facility] 12/22/2013, 9:36 AM  LOS: 7 days

## 2013-12-23 ENCOUNTER — Inpatient Hospital Stay: Payer: BC Managed Care – PPO | Admitting: Pulmonary Disease

## 2013-12-23 LAB — COMPREHENSIVE METABOLIC PANEL
ALBUMIN: 2.7 g/dL — AB (ref 3.5–5.2)
ALK PHOS: 871 U/L — AB (ref 39–117)
ALT: 195 U/L — ABNORMAL HIGH (ref 0–53)
ANION GAP: 14 (ref 5–15)
AST: 195 U/L — ABNORMAL HIGH (ref 0–37)
BUN: 9 mg/dL (ref 6–23)
CO2: 20 mEq/L (ref 19–32)
Calcium: 9 mg/dL (ref 8.4–10.5)
Chloride: 94 mEq/L — ABNORMAL LOW (ref 96–112)
Creatinine, Ser: 0.51 mg/dL (ref 0.50–1.35)
GFR calc Af Amer: >90 mL/min (ref >90)
GFR calc non Af Amer: >90 mL/min (ref >90)
GLUCOSE: 116 mg/dL — AB (ref 70–99)
POTASSIUM: 4 meq/L (ref 3.7–5.3)
Sodium: 128 mEq/L — ABNORMAL LOW (ref 137–147)
Total Bilirubin: 1.8 mg/dL — ABNORMAL HIGH (ref 0.3–1.2)
Total Protein: 6 g/dL (ref 6.0–8.3)

## 2013-12-23 LAB — CBC
HEMATOCRIT: 28.7 % — AB (ref 39.0–52.0)
HEMOGLOBIN: 10.4 g/dL — AB (ref 13.0–17.0)
MCH: 33 pg (ref 26.0–34.0)
MCHC: 36.2 g/dL — ABNORMAL HIGH (ref 30.0–36.0)
MCV: 91.1 fL (ref 78.0–100.0)
Platelets: 125 10*3/uL — ABNORMAL LOW (ref 150–400)
RBC: 3.15 MIL/uL — ABNORMAL LOW (ref 4.22–5.81)
RDW: 13.4 % (ref 11.5–15.5)
WBC: 10.8 10*3/uL — AB (ref 4.0–10.5)

## 2013-12-23 MED ORDER — PROCHLORPERAZINE MALEATE 10 MG PO TABS: 10.0000 mg | ORAL_TABLET | Freq: Four times a day (QID) | ORAL | Status: DC | PRN

## 2013-12-23 MED ORDER — ALTEPLASE 2 MG IJ SOLR: 2.0000 mg | Freq: Once | INTRAMUSCULAR | Status: AC | PRN

## 2013-12-23 MED ORDER — SODIUM CHLORIDE 0.9 % IJ SOLN: 3.0000 mL | INTRAMUSCULAR | Status: DC | PRN

## 2013-12-23 MED ORDER — SODIUM CHLORIDE 0.9 % IJ SOLN: 10.0000 mL | INTRAMUSCULAR | Status: DC | PRN

## 2013-12-23 MED ORDER — HEPARIN SOD (PORK) LOCK FLUSH 100 UNIT/ML IV SOLN: 500.0000 [IU] | Freq: Once | INTRAVENOUS | Status: AC | PRN

## 2013-12-23 MED ORDER — SODIUM CHLORIDE 0.9 % IV SOLN
500.0000 mg | Freq: Once | INTRAVENOUS | Status: AC
  Administered 2013-12-23: 500 mg via INTRAVENOUS
  Filled 2013-12-23: qty 50

## 2013-12-23 MED ORDER — HEPARIN SOD (PORK) LOCK FLUSH 100 UNIT/ML IV SOLN: 250.0000 [IU] | Freq: Once | INTRAVENOUS | Status: AC | PRN

## 2013-12-23 MED ORDER — HOT PACK MISC ONCOLOGY
1.0000 | Freq: Once | Status: AC | PRN
  Filled 2013-12-23: qty 1

## 2013-12-23 MED ORDER — DEXAMETHASONE SODIUM PHOSPHATE 20 MG/5ML IJ SOLN
20.0000 mg | Freq: Once | INTRAMUSCULAR | Status: DC
  Filled 2013-12-23: qty 5

## 2013-12-23 MED ORDER — DEXAMETHASONE SODIUM PHOSPHATE 10 MG/ML IJ SOLN
10.0000 mg | Freq: Once | INTRAMUSCULAR | Status: AC
  Administered 2013-12-23: 10 mg via INTRAVENOUS

## 2013-12-23 MED ORDER — ONDANSETRON HCL 8 MG PO TABS: 8.0000 mg | ORAL_TABLET | Freq: Two times a day (BID) | ORAL | Status: DC

## 2013-12-23 MED ORDER — SODIUM CHLORIDE 0.9 % IV SOLN
60.0000 mg/m2 | Freq: Once | INTRAVENOUS | Status: AC
  Administered 2013-12-23: 120 mg via INTRAVENOUS
  Filled 2013-12-23: qty 6

## 2013-12-23 MED ORDER — POLYETHYLENE GLYCOL 3350 17 G PO PACK
17.0000 g | PACK | Freq: Every day | ORAL | Status: DC | PRN
  Filled 2013-12-23: qty 1

## 2013-12-23 MED ORDER — POLYETHYLENE GLYCOL 3350 17 G PO PACK: 17.0000 g | PACK | Freq: Every day | ORAL | Status: DC

## 2013-12-23 MED ORDER — LORAZEPAM 0.5 MG PO TABS: 0.5000 mg | ORAL_TABLET | Freq: Four times a day (QID) | ORAL | Status: DC | PRN

## 2013-12-23 MED ORDER — DEXAMETHASONE 4 MG PO TABS
4.0000 mg | ORAL_TABLET | Freq: Every day | ORAL | Status: DC
  Administered 2013-12-24 – 2013-12-25 (×2): 4 mg via ORAL
  Filled 2013-12-23 (×3): qty 1

## 2013-12-23 MED ORDER — SODIUM CHLORIDE 0.9 % IV SOLN
Freq: Once | INTRAVENOUS | Status: AC
  Administered 2013-12-23: 12:00:00 via INTRAVENOUS

## 2013-12-23 MED ORDER — SODIUM CHLORIDE 0.9 % IV SOLN
16.0000 mg | Freq: Once | INTRAVENOUS | Status: AC
  Administered 2013-12-23: 16 mg via INTRAVENOUS
  Filled 2013-12-23: qty 8

## 2013-12-23 NOTE — Progress Notes (Signed)
[facility] is providing the following services: Kasandra Knudsen  If patient discharges after hours, please call [phone].   Linward Headland 12/23/2013, 3:21 PM

## 2013-12-23 NOTE — Progress Notes (Addendum)
[facility] PROGRESS NOTE  [name] [name] BZJ:[id number] DOB: January 06, 1946 DOA: 12/15/2013 PCP: [name], MD  Assessment/Plan: #1 Small Cell lung cancer, left/Lung Mass 3 cm left lower lobe/mediastinal adenopathy/liver and brain metastatic lesions Per biopsy of liver high grade poorly differentiated neuroendocrine carcinoma, small cell type with primary lung.  Patient is status post ultrasound-guided biopsy of liver lobe lesion. Biopsies c/w metastatic small cell lung cancer. Patient to start chemotherapy today. Continue Decadron. Abdominal ultrasound with possible IV C. tumor versus thrombus. Per oncology likely tumor and to hold off on anticoagulation at this time. Patient is status post Port-A-Cath placement. Per oncology.  #2 encephalopathy Likely secondary to brain metastases versus alcohol withdrawal. Patient with clinical improvement and close to baseline. Continue Decadron. Continue empiric antibiotics for community-acquired pneumonia.  #3 transaminitis Secondary to liver metastases. Patient is status post ultrasound-guided biopsy c/w high grade poorly differentiated neuroendocrine carcinoma, small cell type.   #4 community acquired pneumonia Clinical improvement. Patient currently on Augmentin day 8/8 of antibiotics. Follow.  #5 alcohol withdrawal On the Ativan CIWA protocol. Continue thiamine and folic acid. Will follow.  #6 hyponatremia Likely secondary to volume depletion versus SIADH secondary to lung mass. Urine osmolality was 900. Urine sodium was less than 20. Patient noted to have poor oral intake prior to admission. Sodium level currently at 128 from 127 yesterday. Increase IV fluids 125 cc per hour and follow.  #7 constipation Change lactulose to MiraLAX daily PRN.  #8 low back pain/spondylosis with associated Lumbago Continue pain management.  #9 thrombocytopenia Currently stable. No signs of bleeding. Improving. Follow.  #10 gastroesophageal  reflux disease PPI.  #11 history of asthma Stable.  #12 prophylaxis SCDs for DVT prophylaxis.   Code Status: Full Family Communication: Updated patient and wife at bedside Disposition Plan: remain in medsurg.   Consultants:  Oncology: Dr. Marin Olp 12/19/2013  PCCM : [name] 12/16/13  Neurosurgery; Dr Maryjean Ka 12/20/13  Procedures: Mr Brain W Wo Contrast 12/20/2013 Bilateral cerebral/cerebellar lesions c/w metastatic disease, ? high-grade tumor, ? small cell lung cancer given the chest findings, ? metastatic disease of the dura. US Biopsy 12/20/2013 Successful ultrasound-guided core biopsy of a left lobe liver lesion.  CT angiogram chest 12/15/2013 CT head without contrast 12/17/2013 Right upper quadrant ultrasound 12/19/2013 Chest x-ray 12/15/2013 [facility] Port-A-Cath placement 12/21/2013 per interventional radiology  Antibiotics: Ceftriaxone 8-5---8-8  Azithromycin. 8-5----8-8  Augmentin. 8-8----12/23/13   HPI/Subjective: Patient states she's feeling better. Patient states shortness of breath with ambulation. Patient alert and oriented x 3. Patient denies any pain.  Objective: Filed Vitals:   12/23/13 0525  BP: 161/94  Pulse: 74  Temp: 98.5 F (36.9 C)  Resp: 20    Intake/Output Summary (Last 24 hours) at 12/23/13 1028 Last data filed at 12/23/13 0617  Gross per 24 hour  Intake   4734 ml  Output    800 ml  Net   3934 ml   Filed Weights   12/21/13 0400 12/22/13 0500 12/22/13 2010  Weight: 75.4 kg (166 lb 3.6 oz) 74.9 kg (165 lb 2 oz) 74.889 kg (165 lb 1.6 oz)    Exam:   General:  NAD  Cardiovascular: RRR  Respiratory: CTAB  Abdomen: Soft, nontender, nondistended, positive bowel sounds.  Musculoskeletal: No clubbing cyanosis or edema  Data Reviewed: Basic Metabolic Panel:  Recent Labs Lab 12/20/13 0008 12/20/13 1230 12/21/13 1117 12/22/13 0340 12/23/13 0412  NA 126* 126* 127* 127* 128*  K 3.9 3.9 3.9 4.2 4.0  CL 89* 91* 91* 93*  94*  CO2 21 19  21 19 20   GLUCOSE 104* 96 121* 118* 116*  BUN 11 9 11 11 9   CREATININE 0.53 0.43* 0.51 0.55 0.51  CALCIUM 8.6 8.6 9.2 9.1 9.0   Liver Function Tests:  Recent Labs Lab 12/18/13 0830 12/22/13 0340 12/23/13 0412  AST 141* 135* 195*  ALT 150* 148* 195*  ALKPHOS 542* 716* 871*  BILITOT 1.9* 1.6* 1.8*  PROT 6.1 5.6* 6.0  ALBUMIN 2.9* 2.5* 2.7*   No results found for this basename: LIPASE, AMYLASE,  in the last 168 hours  Recent Labs Lab 12/18/13 0830  AMMONIA 23   CBC:  Recent Labs Lab 12/18/13 0830 12/19/13 0350 12/20/13 0008 12/22/13 0340 12/23/13 0412  WBC 6.5 6.1 7.3 7.7 10.8*  HGB 12.1* 11.9* 11.3* 10.3* 10.4*  HCT 32.9* 32.7* 31.7* 28.4* 28.7*  MCV 89.4 91.6 90.1 90.2 91.1  PLT 127* 123* 126* 108* 125*   Cardiac Enzymes: No results found for this basename: CKTOTAL, CKMB, CKMBINDEX, TROPONINI,  in the last 168 hours BNP (last 3 results)  Recent Labs  12/15/13 1750  PROBNP 192.0*   CBG:  Recent Labs Lab 12/16/13 1123 12/16/13 1613  GLUCAP 112* 122*    Recent Results (from the past 240 hour(s))  MRSA PCR SCREENING     Status: None   Collection Time    12/17/13  5:00 PM      Result Value Ref Range Status   MRSA by PCR NEGATIVE  NEGATIVE Final   Comment:            The GeneXpert MRSA Assay (FDA     approved for NASAL specimens     only), is one component of a     comprehensive MRSA colonization     surveillance program. It is not     intended to diagnose MRSA     infection nor to guide or     monitor treatment for     MRSA infections.     Studies: Ir Fluoro Guide Cv Line Right  12/21/2013   CLINICAL DATA:  METASTATIC LUNG CANCER  EXAM: RIGHT IJ SINGLE LUMEN POWER PORT CATHETER INSERTION  Date:  8/11/20158/03/2014 3:57 pm  Radiologist:  M. Daryll Brod, MD  Guidance:  Ultrasound fluoroscopic  FLUOROSCOPY TIME:  36 seconds  MEDICATIONS AND MEDICAL HISTORY: 2 g Ancefadministered within 1 hour of the procedure.1.5 mg Versed, 50 mcg fentanyl   ANESTHESIA/SEDATION: 30 min  CONTRAST:  None.  COMPLICATIONS: None immediate  PROCEDURE: Informed consent was obtained from the patient following explanation of the procedure, risks, benefits and alternatives. The patient understands, agrees and consents for the procedure. All questions were addressed. A time out was performed.  Maximal barrier sterile technique utilized including caps, mask, sterile gowns, sterile gloves, large sterile drape, hand hygiene, and 2% chlorhexidine scrub.  Under sterile conditions and local anesthesia, right internal micropuncture venous access was performed. Access was performed with ultrasound. Images were obtained for documentation. A guide wire was inserted followed by a transitional dilator. This allowed insertion of a guide wire and catheter into the IVC. Measurements were obtained from the SVC / RA junction back to the right IJ venotomy site. In the right infraclavicular chest, a subcutaneous pocket was created over the second anterior rib. This was done under sterile conditions and local anesthesia. 1% lidocaine with epinephrine was utilized for this. A 2.5 cm incision was made in the skin. Blunt dissection was performed to create a subcutaneous pocket over the right  pectoralis major muscle. The pocket was flushed with saline vigorously. There was adequate hemostasis. The port catheter was assembled and checked for leakage. The port catheter was secured in the pocket with two retention sutures. The tubing was tunneled subcutaneously to the Right venotomy site and inserted into the SVC/RA junction through a valved peel-away sheath. Position was confirmed with fluoroscopy. Images were obtained for documentation. The patient tolerated the procedure well. No immediate complications. Incisions were closed in a two layer fashion with 4 - 0 Vicryl suture. Dermabond was applied to the skin. The port catheter was accessed, blood was aspirated followed by saline and heparin flushes.  Needle was removed. A dry sterile dressing was applied.  IMPRESSION: Ultrasound and fluoroscopically guided right internal jugular single lumen power port catheter insertion. Tip in the SVC/RA junction. Catheter ready for use.   Electronically Signed   By: Daryll Brod M.D.   On: 12/21/2013 16:00   Ir US Guide Vasc Access Right  12/21/2013   CLINICAL DATA:  METASTATIC LUNG CANCER  EXAM: RIGHT IJ SINGLE LUMEN POWER PORT CATHETER INSERTION  Date:  8/11/20158/03/2014 3:57 pm  Radiologist:  M. Daryll Brod, MD  Guidance:  Ultrasound fluoroscopic  FLUOROSCOPY TIME:  36 seconds  MEDICATIONS AND MEDICAL HISTORY: 2 g Ancefadministered within 1 hour of the procedure.1.5 mg Versed, 50 mcg fentanyl  ANESTHESIA/SEDATION: 30 min  CONTRAST:  None.  COMPLICATIONS: None immediate  PROCEDURE: Informed consent was obtained from the patient following explanation of the procedure, risks, benefits and alternatives. The patient understands, agrees and consents for the procedure. All questions were addressed. A time out was performed.  Maximal barrier sterile technique utilized including caps, mask, sterile gowns, sterile gloves, large sterile drape, hand hygiene, and 2% chlorhexidine scrub.  Under sterile conditions and local anesthesia, right internal micropuncture venous access was performed. Access was performed with ultrasound. Images were obtained for documentation. A guide wire was inserted followed by a transitional dilator. This allowed insertion of a guide wire and catheter into the IVC. Measurements were obtained from the SVC / RA junction back to the right IJ venotomy site. In the right infraclavicular chest, a subcutaneous pocket was created over the second anterior rib. This was done under sterile conditions and local anesthesia. 1% lidocaine with epinephrine was utilized for this. A 2.5 cm incision was made in the skin. Blunt dissection was performed to create a subcutaneous pocket over the right pectoralis major  muscle. The pocket was flushed with saline vigorously. There was adequate hemostasis. The port catheter was assembled and checked for leakage. The port catheter was secured in the pocket with two retention sutures. The tubing was tunneled subcutaneously to the Right venotomy site and inserted into the SVC/RA junction through a valved peel-away sheath. Position was confirmed with fluoroscopy. Images were obtained for documentation. The patient tolerated the procedure well. No immediate complications. Incisions were closed in a two layer fashion with 4 - 0 Vicryl suture. Dermabond was applied to the skin. The port catheter was accessed, blood was aspirated followed by saline and heparin flushes. Needle was removed. A dry sterile dressing was applied.  IMPRESSION: Ultrasound and fluoroscopically guided right internal jugular single lumen power port catheter insertion. Tip in the SVC/RA junction. Catheter ready for use.   Electronically Signed   By: Daryll Brod M.D.   On: 12/21/2013 16:00    Scheduled Meds: . allopurinol  100 mg Oral Daily  . amoxicillin-clavulanate  1 tablet Oral Q12H  . budesonide-formoterol  2 puff  Inhalation BID  . cholecalciferol  1,000 Units Oral q morning - 10a  . dexamethasone  4 mg Oral Daily  . enoxaparin (LOVENOX) injection  40 mg Subcutaneous Q24H  . feeding supplement (ENSURE COMPLETE)  237 mL Oral TID WC  . folic acid  1 mg Oral Daily  . loratadine  10 mg Oral Daily  . mesalamine  1,000 mg Rectal QHS  . montelukast  10 mg Oral QHS  . multivitamin with minerals  1 tablet Oral Daily  . nystatin  10 mL Oral QID  . pantoprazole  40 mg Oral Daily  . thiamine  100 mg Oral Daily   Or  . thiamine  100 mg Intravenous Daily  . traZODone  100 mg Oral QHS   Continuous Infusions: . sodium chloride 125 mL/hr at 12/23/13 1505    Principal Problem:   Small cell carcinoma of lung Active Problems:   CAP (community acquired pneumonia)   Hyponatremia   Acute encephalopathy    HYPERLIPIDEMIA   Intrinsic asthma   GERD   Lung mass   Mediastinal adenopathy   Low back pain   Weight loss    Time spent: 40 mins    [facility] MD [facility] Pager [phone]. If 7PM-7AM, please contact night-coverage at [email or web address], password [facility] 12/23/2013, 10:28 AM  LOS: 8 days

## 2013-12-23 NOTE — Progress Notes (Signed)
We have a diagnosis of small cell lung cancer. This is no surprise. We now will start chemotherapy. His liver tests are going up. I think this is highly indicative of the hepatic involvement by his underlying malignancy.  We will have to decrease the dose of etoposide because of the elevated liver function tests.  I have him on allopurinol in case of tumor lysis which certainly could be a possibility.  There may be a little more confusion. This may be the Decadron. I will cut back his Decadron dose.  His appetite she is to be doing a little better. There's no nausea or vomiting.  He's had no problems with cough. There is no diarrhea. She's had no leg swelling. He's had no rashes.  His vital signs look be stable. Blood pressure is up a little bit at 161/94.  His lungs are clear. Cardiac exam is regular in rhythm. Abdomen is soft. Liver edge might be at the right costal margin. Extremities shows no clubbing cyanosis or edema. Neurological exam shows no obvious neurological deficits.  We will proceed with chemotherapy today. We will use carboplatin and etoposide. I did go over the side effects of the chemotherapy again. He has had a chemotherapy education by our chemotherapy nurse. His family has information booklets.  I told him that it will be 3 days of treatment. If all goes well, we can try to get him home on Saturday.  We will have to watch him closely as an outpatient because of the extensive hepatic involvement and the risk of tumor lysis.

## 2013-12-23 NOTE — Progress Notes (Signed)
Patient tolerated Carboplatin and Etoposide without any problems. Port-a-cath remained C/D/I and had blood return prior to and after the chemotherapy.   [name] [name] [facility]  12/23/2013  3:18 PM

## 2013-12-24 DIAGNOSIS — D649 Anemia, unspecified: Secondary | ICD-10-CM | POA: Diagnosis present

## 2013-12-24 LAB — BASIC METABOLIC PANEL WITH GFR
Anion gap: 14 (ref 5–15)
BUN: 10 mg/dL (ref 6–23)
CO2: 22 meq/L (ref 19–32)
Calcium: 8.6 mg/dL (ref 8.4–10.5)
Chloride: 97 meq/L (ref 96–112)
Creatinine, Ser: 0.47 mg/dL — ABNORMAL LOW (ref 0.50–1.35)
GFR calc Af Amer: >90 mL/min
GFR calc non Af Amer: >90 mL/min
Glucose, Bld: 95 mg/dL (ref 70–99)
Potassium: 4 meq/L (ref 3.7–5.3)
Sodium: 133 meq/L — ABNORMAL LOW (ref 137–147)

## 2013-12-24 LAB — CBC
HEMATOCRIT: 26 % — AB (ref 39.0–52.0)
HEMOGLOBIN: 8.9 g/dL — AB (ref 13.0–17.0)
MCH: 31.8 pg (ref 26.0–34.0)
MCHC: 34.2 g/dL (ref 30.0–36.0)
MCV: 92.9 fL (ref 78.0–100.0)
Platelets: 108 10*3/uL — ABNORMAL LOW (ref 150–400)
RBC: 2.8 MIL/uL — ABNORMAL LOW (ref 4.22–5.81)
RDW: 13.6 % (ref 11.5–15.5)
WBC: 9.1 10*3/uL (ref 4.0–10.5)

## 2013-12-24 LAB — FERRITIN: Ferritin: 4008 ng/mL — ABNORMAL HIGH (ref 22–322)

## 2013-12-24 LAB — IRON AND TIBC
Iron: 212 ug/dL — ABNORMAL HIGH (ref 42–135)
Saturation Ratios: 80 % — ABNORMAL HIGH (ref 20–55)
TIBC: 265 ug/dL (ref 215–435)
UIBC: 53 ug/dL — ABNORMAL LOW (ref 125–400)

## 2013-12-24 LAB — NEURON-SPECIFIC ENOLASE(NSE), BLOOD: Neuron Specific Enolase: >150 ng/mL (ref <10.8)

## 2013-12-24 LAB — FOLATE: Folate: >20 ng/mL

## 2013-12-24 LAB — VITAMIN B12: Vitamin B-12: 1363 pg/mL — ABNORMAL HIGH (ref 211–911)

## 2013-12-24 MED ORDER — SODIUM CHLORIDE 0.9 % IV SOLN
Freq: Once | INTRAVENOUS | Status: AC
  Administered 2013-12-24: 12:00:00 via INTRAVENOUS

## 2013-12-24 MED ORDER — HEPARIN SOD (PORK) LOCK FLUSH 100 UNIT/ML IV SOLN: 250.0000 [IU] | Freq: Once | INTRAVENOUS | Status: AC | PRN

## 2013-12-24 MED ORDER — SODIUM CHLORIDE 0.9 % IV SOLN
60.0000 mg/m2 | Freq: Once | INTRAVENOUS | Status: AC
  Administered 2013-12-24: 120 mg via INTRAVENOUS
  Filled 2013-12-24: qty 6

## 2013-12-24 MED ORDER — PROCHLORPERAZINE MALEATE 10 MG PO TABS
10.0000 mg | ORAL_TABLET | Freq: Once | ORAL | Status: AC
  Administered 2013-12-24: 10 mg via ORAL
  Filled 2013-12-24: qty 1

## 2013-12-24 MED ORDER — ONDANSETRON 8 MG/NS 50 ML IVPB: 8.0000 mg | INTRAVENOUS | Status: DC | PRN

## 2013-12-24 MED ORDER — PROMETHAZINE HCL 25 MG/ML IJ SOLN
12.5000 mg | Freq: Four times a day (QID) | INTRAMUSCULAR | Status: DC | PRN
Start: 2013-12-24 — End: 2013-12-25
  Administered 2013-12-25 (×2): 12.5 mg via INTRAVENOUS
  Filled 2013-12-24 (×2): qty 1

## 2013-12-24 MED ORDER — HOT PACK MISC ONCOLOGY
1.0000 | Freq: Once | Status: AC | PRN
  Filled 2013-12-24: qty 1

## 2013-12-24 MED ORDER — SODIUM CHLORIDE 0.9 % IJ SOLN: 3.0000 mL | INTRAMUSCULAR | Status: DC | PRN

## 2013-12-24 MED ORDER — SODIUM CHLORIDE 0.9 % IJ SOLN: 10.0000 mL | INTRAMUSCULAR | Status: DC | PRN

## 2013-12-24 MED ORDER — BIOTENE DRY MOUTH MT LIQD
15.0000 mL | OROMUCOSAL | Status: DC
  Administered 2013-12-24 – 2013-12-25 (×5): 15 mL via OROMUCOSAL

## 2013-12-24 MED ORDER — METOCLOPRAMIDE HCL 5 MG/ML IJ SOLN
10.0000 mg | Freq: Once | INTRAMUSCULAR | Status: AC
  Administered 2013-12-24: 10 mg via INTRAVENOUS

## 2013-12-24 MED ORDER — PROMETHAZINE HCL 25 MG PO TABS: 25.0000 mg | ORAL_TABLET | Freq: Four times a day (QID) | ORAL | Status: DC | PRN

## 2013-12-24 MED ORDER — HEPARIN SOD (PORK) LOCK FLUSH 100 UNIT/ML IV SOLN: 500.0000 [IU] | Freq: Once | INTRAVENOUS | Status: AC | PRN

## 2013-12-24 MED ORDER — ONDANSETRON HCL 8 MG PO TABS
8.0000 mg | ORAL_TABLET | ORAL | Status: DC | PRN
Start: 2013-12-24 — End: 2013-12-25
  Administered 2013-12-25: 8 mg via ORAL
  Filled 2013-12-24: qty 1

## 2013-12-24 MED ORDER — ALTEPLASE 2 MG IJ SOLR
2.0000 mg | Freq: Once | INTRAMUSCULAR | Status: AC | PRN
  Filled 2013-12-24: qty 2

## 2013-12-24 NOTE — Progress Notes (Signed)
INITIAL NUTRITION ASSESSMENT  Pt meets criteria for severe MALNUTRITION in the context of chronic illness as evidenced by 5.7% weight loss with <75% estimated energy intake in the past month per pt report.  DOCUMENTATION CODES Per approved criteria  -Severe malnutrition in the context of chronic illness   INTERVENTION: - Continue Ensure Complete TID - Assisted pt with ordering meal - Encouraged increased meal intake - RD to continue to monitor   NUTRITION DIAGNOSIS: Increased nutrient needs related to lung CA on chemotherapy as evidenced by MD notes.   Goal: Pt to consume >90% of meals/supplements  Monitor:  Weights, labs, intake  Reason for Assessment: Malnutrition screening tool   68 y.o. male  Admitting Dx: Small cell carcinoma of lung  ASSESSMENT: Pt with history of Asthma, Hyperlipidemia, and GERD who presents to the ED with complaints of increased SOB and progressive weakness over the past 2 weeks. He reports that he has had a loss of appetite over the past 2 months and has lost 15 pounds. Found to have lung mass and numerous liver masses. On CIWA protocol when admitted due to hx of alcohol abuse. Found to have new diagnosis of small cel lung CA with liver and brain metastatic lesions. Had first dose of chemotherapy 12/23/13.   - Pt and friend in room who report pt has been eating 2 meals/day and drinking 1-2 Ensure Complete/day. Likes to eat chicken.  - Pt couldn't remember how he was eating at home, suspected less than 2 meals/day - Denies any nausea or changes in taste - Weight trend shows pt's weight down 10 pounds in the past month - No PO intake documented recently  - Pt appeared too confused to perform nutrition focused physical exam, would sit up abruptly during conversation in bed   Alk phos elevated and trending up AST/ALT elevated Total bilirubin elevated   Height: Ht Readings from Last 1 Encounters:  12/22/13 6' (1.829 m)    Weight: Wt Readings from  Last 1 Encounters:  12/22/13 165 lb 1.6 oz (74.889 kg)    Ideal Body Weight: 178 lbs  % Ideal Body Weight: 93%  Wt Readings from Last 10 Encounters:  12/22/13 165 lb 1.6 oz (74.889 kg)  11/24/13 175 lb (79.379 kg)  11/02/13 179 lb 6.4 oz (81.375 kg)  02/10/13 187 lb (84.823 kg)  12/30/12 178 lb (80.74 kg)  10/28/12 177 lb (80.287 kg)  07/15/12 188 lb 3.2 oz (85.367 kg)  01/07/12 187 lb 2 oz (84.879 kg)  12/31/11 186 lb (84.369 kg)  08/08/11 187 lb (84.823 kg)    Usual Body Weight: 180 lbs  % Usual Body Weight: 92%  BMI:  Body mass index is 22.39 kg/(m^2).  Estimated Nutritional Needs: Kcal: 2250-2450 Protein: 105-125g Fluid: per MD  Skin: intact  Diet Order: General  EDUCATION NEEDS: -No education needs identified at this time   Intake/Output Summary (Last 24 hours) at 12/24/13 0930 Last data filed at 12/23/13 1400  Gross per 24 hour  Intake 1678.92 ml  Output      0 ml  Net 1678.92 ml    Last BM: 8/12  Labs:   Recent Labs Lab 12/22/13 0340 12/23/13 0412 12/24/13 0500  NA 127* 128* 133*  K 4.2 4.0 4.0  CL 93* 94* 97  CO2 19 20 22   BUN 11 9 10   CREATININE 0.55 0.51 0.47*  CALCIUM 9.1 9.0 8.6  GLUCOSE 118* 116* 95    CBG (last 3)  No results found for this  basename: GLUCAP,  in the last 72 hours  Scheduled Meds: . sodium chloride   Intravenous Once  . allopurinol  100 mg Oral Daily  . antiseptic oral rinse  15 mL Mouth Rinse Q4H  . budesonide-formoterol  2 puff Inhalation BID  . cholecalciferol  1,000 Units Oral q morning - 10a  . dexamethasone  4 mg Oral Daily  . enoxaparin (LOVENOX) injection  40 mg Subcutaneous Q24H  . etoposide  60 mg/m2 (Treatment Plan Actual) Intravenous Once  . feeding supplement (ENSURE COMPLETE)  237 mL Oral TID WC  . folic acid  1 mg Oral Daily  . loratadine  10 mg Oral Daily  . mesalamine  1,000 mg Rectal QHS  . montelukast  10 mg Oral QHS  . multivitamin with minerals  1 tablet Oral Daily  . nystatin  10  mL Oral QID  . pantoprazole  40 mg Oral Daily  . prochlorperazine  10 mg Oral Once  . thiamine  100 mg Oral Daily   Or  . thiamine  100 mg Intravenous Daily  . traZODone  100 mg Oral QHS    Continuous Infusions: . sodium chloride 75 mL/hr at 12/24/13 0820    Past Medical History  Diagnosis Date  . Asthma   . Other and unspecified hyperlipidemia   . GERD (gastroesophageal reflux disease)   . Hypercalcemia   . Skin cancer   . Colitis 2012    Colonoscopy  . External hemorrhoids without mention of complication 1610    Colonoscopy   . Diverticulosis of colon (without mention of hemorrhage) 2008    Colonoscopy  . History of colon polyps 2008    Colonoscopy  . Hiatal hernia 2008    EGD   . Small cell carcinoma of lung 12/22/2013    Past Surgical History  Procedure Laterality Date  . Rectal surgery  2004    fistula repair  . Vasectomy    . Inguinal hernia repair      Carlis Stable MS, RD, LDN [phone] Pager [phone] Weekend/After Hours Pager

## 2013-12-24 NOTE — Progress Notes (Signed)
[name] is doing well. He had his first day of chemotherapy yesterday. There were no problems at all.  There is still some confusion. I think this may be the case for am a little bit. Am not sure if a brain metastases are contributing to this.  His sodium is getting better. Is up to 133.  He is eating, maybe a little but more. There is no nausea or vomiting.  He has not had any diarrhea.  He is out of bed a little bit.  There are no mouth sores.  He is on allopurinol for the possibility of tumor lysis.  His vital signs all look stable. His temperature is 98.3. Blood pressure is 145/96. Pulse is 87. His lungs are clear. Cardiac exam regular rate and rhythm with no murmurs rubs or bruits. Abdomen is soft. Has good bowel sounds. The maybe some slight distention. Liver edge might be palpable at the right costal margin. Extremities shows no clubbing, cyanosis or edema. Neurological exam is non-focal.  He will receive day #2 of chemotherapy today.  If all looks good for tomorrow, after his third day of chemotherapy, he should be able to go home. We can give him Neulasta as an outpatient.  I appreciate all the outstanding care that he is getting from the hospitalist and the staff on 3 [address].!!!  Stormy Card [phone]

## 2013-12-24 NOTE — Progress Notes (Signed)
PT Cancellation Note  ___Treatment cancelled today due to medical issues with patient which prohibited therapy  ___ Treatment cancelled today due to patient receiving procedure or test   _X_ Treatment cancelled today due to patient's refusal to participate ......... "not right now".  Spouse stated she would walk with him later this evening  ___ Treatment cancelled today due to  Rica Koyanagi  PTA [facility]  Acute  Rehab Pager      [phone]

## 2013-12-24 NOTE — Progress Notes (Signed)
[facility] PROGRESS NOTE  [name] [name] GUR:[id number] DOB: 1945/08/18 DOA: 12/15/2013 PCP: [name], MD  Assessment/Plan: #1 Small Cell lung cancer, left/Lung Mass 3 cm left lower lobe/mediastinal adenopathy/liver and brain metastatic lesions Per biopsy of liver high grade poorly differentiated neuroendocrine carcinoma, small cell type with primary lung.  Patient is status post ultrasound-guided biopsy of liver lobe lesion. Biopsies c/w metastatic small cell lung cancer. Patient to start chemotherapy today. Continue Decadron. Abdominal ultrasound with possible IV C. tumor versus thrombus. Per oncology likely tumor and to hold off on anticoagulation at this time. Patient is status post Port-A-Cath placement. Patient started chemotherapy yesterday per oncology.   #2 encephalopathy Likely secondary to brain metastases versus alcohol withdrawal. Patient with clinical improvement and close to baseline. Continue Decadron. Patient has been started on chemotherapy per oncology.  #3 transaminitis Secondary to liver metastases. Patient is status post ultrasound-guided biopsy c/w high grade poorly differentiated neuroendocrine carcinoma, small cell type.   #4 community acquired pneumonia Clinical improvement. Patient s/p 8 days antibiotics.  Follow.  #5 alcohol withdrawal On the Ativan CIWA protocol. Continue thiamine and folic acid. Will follow.  #6 hyponatremia Likely secondary to volume depletion versus SIADH secondary to lung mass. Urine osmolality was 900. Urine sodium was less than 20. Patient noted to have poor oral intake prior to admission. Sodium level currently at 133 from 128 from 127. Continue IV fluids.  #7 Anemia No overt bleeding. Maybe secondary to chemo, dilutional, or AOCD. Check anemia panel. Follow h/h.  #8 constipation  MiraLAX daily PRN.  #9 low back pain/spondylosis with associated Lumbago Continue pain management.  #10 thrombocytopenia Currently  stable. No signs of bleeding. Improving. Follow.  #11 gastroesophageal reflux disease PPI.  #12 history of asthma Stable.  #13 prophylaxis SCDs for DVT prophylaxis.   Code Status: Full Family Communication: Updated patient and wife and son at bedside Disposition Plan: remain in [place]Home in 1-2 days.   Consultants:  Oncology: Dr. Marin Olp 12/19/2013  PCCM : [name] 12/16/13  Neurosurgery; Dr Maryjean Ka 12/20/13  Procedures: Mr Brain W Wo Contrast 12/20/2013 Bilateral cerebral/cerebellar lesions c/w metastatic disease, ? high-grade tumor, ? small cell lung cancer given the chest findings, ? metastatic disease of the dura. US Biopsy 12/20/2013 Successful ultrasound-guided core biopsy of a left lobe liver lesion.  CT angiogram chest 12/15/2013 CT head without contrast 12/17/2013 Right upper quadrant ultrasound 12/19/2013 Chest x-ray 12/15/2013 [facility] Port-A-Cath placement 12/21/2013 per interventional radiology  Antibiotics: Ceftriaxone 8-5---8-8  Azithromycin. 8-5----8-8  Augmentin. 8-8----12/23/13   HPI/Subjective: Patient states he's feeling better. Patient states shortness of breath with ambulation. Patient alert and oriented x 3. Patient denies any pain.   Objective: Filed Vitals:   12/24/13 0449  BP: 145/96  Pulse: 87  Temp: 98.3 F (36.8 C)  Resp: 18    Intake/Output Summary (Last 24 hours) at 12/24/13 1036 Last data filed at 12/23/13 1400  Gross per 24 hour  Intake 1678.92 ml  Output      0 ml  Net 1678.92 ml   Filed Weights   12/21/13 0400 12/22/13 0500 12/22/13 2010  Weight: 75.4 kg (166 lb 3.6 oz) 74.9 kg (165 lb 2 oz) 74.889 kg (165 lb 1.6 oz)    Exam:   General:  NAD  Cardiovascular: RRR  Respiratory: CTAB  Abdomen: Soft, nontender, nondistended, positive bowel sounds.  Musculoskeletal: No clubbing cyanosis or edema  Data Reviewed: Basic Metabolic Panel:  Recent Labs Lab 12/20/13 1230 12/21/13 1117 12/22/13 0340 12/23/13 0412  12/24/13  0500  NA 126* 127* 127* 128* 133*  K 3.9 3.9 4.2 4.0 4.0  CL 91* 91* 93* 94* 97  CO2 19 21 19 20 22   GLUCOSE 96 121* 118* 116* 95  BUN 9 11 11 9 10   CREATININE 0.43* 0.51 0.55 0.51 0.47*  CALCIUM 8.6 9.2 9.1 9.0 8.6   Liver Function Tests:  Recent Labs Lab 12/18/13 0830 12/22/13 0340 12/23/13 0412  AST 141* 135* 195*  ALT 150* 148* 195*  ALKPHOS 542* 716* 871*  BILITOT 1.9* 1.6* 1.8*  PROT 6.1 5.6* 6.0  ALBUMIN 2.9* 2.5* 2.7*   No results found for this basename: LIPASE, AMYLASE,  in the last 168 hours  Recent Labs Lab 12/18/13 0830  AMMONIA 23   CBC:  Recent Labs Lab 12/19/13 0350 12/20/13 0008 12/22/13 0340 12/23/13 0412 12/24/13 0500  WBC 6.1 7.3 7.7 10.8* 9.1  HGB 11.9* 11.3* 10.3* 10.4* 8.9*  HCT 32.7* 31.7* 28.4* 28.7* 26.0*  MCV 91.6 90.1 90.2 91.1 92.9  PLT 123* 126* 108* 125* 108*   Cardiac Enzymes: No results found for this basename: CKTOTAL, CKMB, CKMBINDEX, TROPONINI,  in the last 168 hours BNP (last 3 results)  Recent Labs  12/15/13 1750  PROBNP 192.0*   CBG: No results found for this basename: GLUCAP,  in the last 168 hours  Recent Results (from the past 240 hour(s))  MRSA PCR SCREENING     Status: None   Collection Time    12/17/13  5:00 PM      Result Value Ref Range Status   MRSA by PCR NEGATIVE  NEGATIVE Final   Comment:            The GeneXpert MRSA Assay (FDA     approved for NASAL specimens     only), is one component of a     comprehensive MRSA colonization     surveillance program. It is not     intended to diagnose MRSA     infection nor to guide or     monitor treatment for     MRSA infections.     Studies: No results found.  Scheduled Meds: . sodium chloride   Intravenous Once  . allopurinol  100 mg Oral Daily  . antiseptic oral rinse  15 mL Mouth Rinse Q4H  . budesonide-formoterol  2 puff Inhalation BID  . cholecalciferol  1,000 Units Oral q morning - 10a  . dexamethasone  4 mg Oral Daily  .  enoxaparin (LOVENOX) injection  40 mg Subcutaneous Q24H  . etoposide  60 mg/m2 (Treatment Plan Actual) Intravenous Once  . feeding supplement (ENSURE COMPLETE)  237 mL Oral TID WC  . folic acid  1 mg Oral Daily  . loratadine  10 mg Oral Daily  . mesalamine  1,000 mg Rectal QHS  . montelukast  10 mg Oral QHS  . multivitamin with minerals  1 tablet Oral Daily  . nystatin  10 mL Oral QID  . pantoprazole  40 mg Oral Daily  . prochlorperazine  10 mg Oral Once  . thiamine  100 mg Oral Daily   Or  . thiamine  100 mg Intravenous Daily  . traZODone  100 mg Oral QHS   Continuous Infusions: . sodium chloride 75 mL/hr at 12/24/13 0820    Principal Problem:   Small cell carcinoma of lung Active Problems:   CAP (community acquired pneumonia)   Hyponatremia   Acute encephalopathy   HYPERLIPIDEMIA   Intrinsic asthma   GERD  Lung mass   Mediastinal adenopathy   Low back pain   Weight loss   Anemia    Time spent: 40 mins    [facility] MD [facility] Pager [phone]. If 7PM-7AM, please contact night-coverage at [email or web address], password [facility] 12/24/2013, 10:36 AM  LOS: 9 days

## 2013-12-25 DIAGNOSIS — C7B8 Other secondary neuroendocrine tumors: Secondary | ICD-10-CM

## 2013-12-25 DIAGNOSIS — E46 Unspecified protein-calorie malnutrition: Secondary | ICD-10-CM

## 2013-12-25 DIAGNOSIS — C787 Secondary malignant neoplasm of liver and intrahepatic bile duct: Secondary | ICD-10-CM

## 2013-12-25 DIAGNOSIS — E43 Unspecified severe protein-calorie malnutrition: Secondary | ICD-10-CM | POA: Insufficient documentation

## 2013-12-25 DIAGNOSIS — D696 Thrombocytopenia, unspecified: Secondary | ICD-10-CM

## 2013-12-25 DIAGNOSIS — C7A1 Malignant poorly differentiated neuroendocrine tumors: Secondary | ICD-10-CM

## 2013-12-25 DIAGNOSIS — R110 Nausea: Secondary | ICD-10-CM

## 2013-12-25 DIAGNOSIS — D638 Anemia in other chronic diseases classified elsewhere: Secondary | ICD-10-CM

## 2013-12-25 LAB — CBC
HCT: 26.2 % — ABNORMAL LOW (ref 39.0–52.0)
Hemoglobin: 9.3 g/dL — ABNORMAL LOW (ref 13.0–17.0)
MCH: 32.7 pg (ref 26.0–34.0)
MCHC: 35.5 g/dL (ref 30.0–36.0)
MCV: 92.3 fL (ref 78.0–100.0)
Platelets: 102 10*3/uL — ABNORMAL LOW (ref 150–400)
RBC: 2.84 MIL/uL — ABNORMAL LOW (ref 4.22–5.81)
RDW: 13.7 % (ref 11.5–15.5)
WBC: 7 10*3/uL (ref 4.0–10.5)

## 2013-12-25 LAB — COMPREHENSIVE METABOLIC PANEL
ALBUMIN: 2.6 g/dL — AB (ref 3.5–5.2)
ALK PHOS: 771 U/L — AB (ref 39–117)
ALT: 223 U/L — ABNORMAL HIGH (ref 0–53)
ANION GAP: 14 (ref 5–15)
AST: 213 U/L — ABNORMAL HIGH (ref 0–37)
BUN: 11 mg/dL (ref 6–23)
CALCIUM: 8.7 mg/dL (ref 8.4–10.5)
CO2: 22 mEq/L (ref 19–32)
Chloride: 94 mEq/L — ABNORMAL LOW (ref 96–112)
Creatinine, Ser: 0.48 mg/dL — ABNORMAL LOW (ref 0.50–1.35)
GFR calc Af Amer: >90 mL/min (ref >90)
GFR calc non Af Amer: >90 mL/min (ref >90)
Glucose, Bld: 97 mg/dL (ref 70–99)
POTASSIUM: 3.8 meq/L (ref 3.7–5.3)
Sodium: 130 mEq/L — ABNORMAL LOW (ref 137–147)
TOTAL PROTEIN: 5.5 g/dL — AB (ref 6.0–8.3)
Total Bilirubin: 1.9 mg/dL — ABNORMAL HIGH (ref 0.3–1.2)

## 2013-12-25 LAB — LACTATE DEHYDROGENASE: LDH: 1734 U/L — ABNORMAL HIGH (ref 94–250)

## 2013-12-25 MED ORDER — PROCHLORPERAZINE MALEATE 10 MG PO TABS
10.0000 mg | ORAL_TABLET | Freq: Once | ORAL | Status: AC
  Administered 2013-12-25: 10 mg via ORAL
  Filled 2013-12-25: qty 1

## 2013-12-25 MED ORDER — HEPARIN SOD (PORK) LOCK FLUSH 100 UNIT/ML IV SOLN: 250.0000 [IU] | Freq: Once | INTRAVENOUS | Status: DC | PRN

## 2013-12-25 MED ORDER — HYDROMORPHONE HCL 2 MG PO TABS: 2.0000 mg | ORAL_TABLET | ORAL | Status: DC | PRN

## 2013-12-25 MED ORDER — SODIUM CHLORIDE 0.9 % IV SOLN
Freq: Once | INTRAVENOUS | Status: AC
  Administered 2013-12-25: 11:00:00 via INTRAVENOUS

## 2013-12-25 MED ORDER — ENSURE COMPLETE PO LIQD: 237.0000 mL | Freq: Three times a day (TID) | ORAL | Status: AC

## 2013-12-25 MED ORDER — SODIUM CHLORIDE 0.9 % IJ SOLN: 3.0000 mL | INTRAMUSCULAR | Status: DC | PRN

## 2013-12-25 MED ORDER — ALTEPLASE 2 MG IJ SOLR
2.0000 mg | Freq: Once | INTRAMUSCULAR | Status: DC | PRN
Start: 2013-12-25 — End: 2013-12-25
  Filled 2013-12-25: qty 2

## 2013-12-25 MED ORDER — HOT PACK MISC ONCOLOGY
1.0000 | Freq: Once | Status: DC | PRN
  Filled 2013-12-25: qty 1

## 2013-12-25 MED ORDER — NYSTATIN 100000 UNIT/ML MT SUSP: 10.0000 mL | Freq: Four times a day (QID) | OROMUCOSAL | Status: AC

## 2013-12-25 MED ORDER — HEPARIN SOD (PORK) LOCK FLUSH 100 UNIT/ML IV SOLN
500.0000 [IU] | Freq: Once | INTRAVENOUS | Status: AC | PRN
  Administered 2013-12-25: 500 [IU]
  Filled 2013-12-25: qty 5

## 2013-12-25 MED ORDER — PROMETHAZINE HCL 25 MG PO TABS: 25.0000 mg | ORAL_TABLET | Freq: Four times a day (QID) | ORAL | Status: AC | PRN

## 2013-12-25 MED ORDER — ETOPOSIDE CHEMO INJECTION 1 GM/50ML
60.0000 mg/m2 | Freq: Once | INTRAVENOUS | Status: AC
  Administered 2013-12-25: 120 mg via INTRAVENOUS
  Filled 2013-12-25: qty 6

## 2013-12-25 MED ORDER — DEXAMETHASONE 4 MG PO TABS: 4.0000 mg | ORAL_TABLET | Freq: Every day | ORAL | Status: AC

## 2013-12-25 MED ORDER — THIAMINE HCL 100 MG PO TABS: 100.0000 mg | ORAL_TABLET | Freq: Every day | ORAL | Status: DC

## 2013-12-25 MED ORDER — MORPHINE SULFATE 15 MG PO TABS: 15.0000 mg | ORAL_TABLET | ORAL | Status: DC | PRN

## 2013-12-25 MED ORDER — ADULT MULTIVITAMIN W/MINERALS CH: 1.0000 | ORAL_TABLET | Freq: Every day | ORAL | Status: DC

## 2013-12-25 MED ORDER — SODIUM CHLORIDE 0.9 % IJ SOLN: 10.0000 mL | INTRAMUSCULAR | Status: DC | PRN

## 2013-12-25 MED ORDER — DOCUSATE SODIUM 100 MG PO CAPS: 100.0000 mg | ORAL_CAPSULE | Freq: Two times a day (BID) | ORAL | Status: AC

## 2013-12-25 MED ORDER — ALLOPURINOL 100 MG PO TABS: 100.0000 mg | ORAL_TABLET | Freq: Every day | ORAL | Status: AC

## 2013-12-25 MED ORDER — FOLIC ACID 1 MG PO TABS: 1.0000 mg | ORAL_TABLET | Freq: Every day | ORAL | Status: AC

## 2013-12-25 NOTE — Progress Notes (Signed)
Tolerated chemo well. Discharge intructions given to patient and daughter , verbalized understanding.

## 2013-12-25 NOTE — Discharge Summary (Signed)
Physician Discharge Summary  [name] GEZ:[id number] DOB: 04-22-1946 DOA: 12/15/2013  PCP: [name] Shelling, MD  Admit date: 12/15/2013 Discharge date: 12/25/2013  Time spent: 65 minutes  Recommendations for Outpatient Follow-up:  1. Followup with Dr. Marin [name] of oncology on Monday, 12/27/2013 for further management of his metastatic small cell lung cancer 2. Followup with her [name] Shelling, MD a one-week period of followup she'll need a CBC and a basic metabolic profile done to followup on electrolytes and renal function.  Discharge Diagnoses:  Principal Problem:   Small cell carcinoma of lung Active Problems:   CAP (community acquired pneumonia)   Hyponatremia   Acute encephalopathy   HYPERLIPIDEMIA   Intrinsic asthma   GERD   Lung mass   Mediastinal adenopathy   Low back pain   Weight loss   Anemia   Protein-calorie malnutrition, severe   Discharge Condition: stable  Diet recommendation: Regular  Filed Weights   12/21/13 0400 12/22/13 0500 12/22/13 2010  Weight: 75.4 kg (166 lb 3.6 oz) 74.9 kg (165 lb 2 oz) 74.889 kg (165 lb 1.6 oz)    History of present illness:  [name] [name] is a 68 y.o. male with a history of Asthma, Hyperlipidemia, and GERD who presented to the ED with complaints of increased SOB and progressive weakness over the past 2 weeks prior to admission. He reported that he had a loss of appetite over the past 2 months and had lost 15 pounds. He denied any fevers or chills or cough. He has also had low back pain for the past Month, and denied any history of trauma. 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 He was given Muscle relaxant Rx and Hydrocodone for his pain. He was seen by his PCP on day of admission and had an EKG and was sent to the ED for a further workup. A d-Dimer was performed and thus a CTA of the Chest was performed to rule out a Pulmonary  Embolism and the findings were Negative for a PE but revealed a 3 cm LLL mass, mediastinal and perirenal adenopathy, numerous Liver lesions, and Airspace Disease of the RLL. He was placed on IV Rocephin and Azithromycin and Referred for medical admission.    Hospital Course:  #1 Small Cell lung cancer, left/Lung Mass 3 cm left lower lobe/mediastinal adenopathy/liver and brain metastatic lesions  On admission CT of the chest which was done did show a left lung mass. Due to concerns for lung cancer patient was subsequently transferred from [facility] to [facility]. CT of the chest which was done did show numerous ill-defined hypodense nodules in the lobulated liver surface. Interventional radiology was consulted and patient subsequently underwent an ultrasound-guided liver biopsy. Oncology was also consulted. Per biopsy of liver, high grade poorly differentiated neuroendocrine carcinoma, small cell type with primary lung. Patient is status post ultrasound-guided biopsy of liver lobe lesion. Biopsies c/w metastatic small cell lung cancer. Patient was started chemotherapy and received 3 days worth of chemotherapy. Patient was also maintained on Decadron.  Abdominal ultrasound with possible IV C. tumor versus thrombus. Per oncology likely tumor and to hold off on anticoagulation at this time. Patient also underwent  Port-A-Cath placement. Patient will be discharged home after day #3 of chemotherapy and is to followup with oncology, Dr. Marin [name] on Monday, 12/27/2013.  #2 encephalopathy  Likely secondary to brain metastases versus alcohol withdrawal.  Patient with clinical improvement and close to baseline. Patient was started on Decadron. Patient improved clinically was close to baseline by day of discharge.  #3 transaminitis  Secondary to liver metastases. Patient is status post ultrasound-guided biopsy c/w high grade poorly differentiated neuroendocrine carcinoma, small cell type. Outpatient  followup. #4 community acquired pneumonia  On admission patient was noted to have a community acquired pneumonia. Patient was initially placed on IV Rocephin and IV azithromycin. Patient improved clinically. Patient was subsequently transitioned to oral Levaquin and completed a 8 day course of antibiotic therapy. Patientwill  be discharged in stable and improved condition.  #5 will alcohol withdrawal  Patient was placed on the Ativan CIWA protocol. Patient was maintained on thiamine and folic acid and remained stable throughout the hospitalization.  #6 hyponatremia  Likely secondary to volume depletion versus SIADH secondary to lung mass. Urine osmolality was 900. Urine sodium was less than 20. Patient noted to have poor oral intake prior to admission. Patient was hydrated gently with IV fluids with improvement of his hyponatremia. On day of discharge patient's sodium was 130. Outpatient followup.  #7 Anemia  No overt bleeding. Maybe secondary to chemo, dilutional, or AOCD. Anemia panel was consistent with anemia of chronic disease. Patient's hemoglobin remained stable. Outpatient followup.  #8 constipation  MiraLAX daily PRN.  #9 low back pain/spondylosis with associated Lumbago  Patient was noted to have low back pain. Patient was placed on some Dilaudid oral pills which improved his pain. Patient will be discharged on oral MSIR for pain management.   #10 thrombocytopenia  Currently stable. No signs of bleeding. Improving. Outpatient followup. #11 gastroesophageal reflux disease  Patient was maintained on a PPI, throughout the hospitalization.  #12 history of asthma  Stable.      Procedures: Mr Kizzie Fantasia Contrast 12/20/2013 Bilateral cerebral/cerebellar lesions c/w metastatic disease, ? high-grade tumor, ? small cell lung cancer given the chest findings, ? metastatic disease of the dura.  US Biopsy 12/20/2013 Successful ultrasound-guided core biopsy of a left lobe liver lesion.  CT  angiogram chest 12/15/2013  CT head without contrast 12/17/2013  Right upper quadrant ultrasound 12/19/2013  Chest x-ray 12/15/2013  RIJ Port-A-Cath placement 12/21/2013 per interventional radiology   Consultations: Oncology: Dr. Marin [name] 12/19/2013  PCCM : [name] 12/16/13  Neurosurgery; Dr Maryjean Ka 12/20/13     Discharge Exam: Filed Vitals:   12/25/13 0531  BP: 148/80  Pulse:   Temp:   Resp:     General: NAD Cardiovascular: RRR Respiratory: CTAB  Discharge Instructions You were cared for by a hospitalist during your hospital stay. If you have any questions about your discharge medications or the care you received while you were in the hospital after you are discharged, you can call the unit and asked to speak with the hospitalist on call if the hospitalist that took care of you is not available. Once you are discharged, your primary care physician will handle any further medical issues. Please note that NO REFILLS for any discharge medications will be authorized once you are discharged, as it is imperative that you return to your primary care physician (or establish a relationship with a primary care physician if you do not have one) for your aftercare needs so that they can reassess your need for medications and monitor your lab values.      Discharge Instructions   Diet general    Complete by:  As directed      Discharge instructions    Complete by:  As directed   Follow up with [name] Shelling, MD in 1 week. Follow up with Dr [name] [name] as scheduled.     Increase activity slowly    Complete by:  As directed      SCHEDULING COMMUNICATION    Complete by:  As directed   Chemotherapy Appointment - 2.5 hr     SCHEDULING COMMUNICATION    Complete by:  As directed   Chemotherapy Appointment - 2 hr     SCHEDULING COMMUNICATION    Complete by:  As directed   Chemotherapy Appointment - 2 hr     TREATMENT CONDITIONS    Complete by:  As directed   Notify the MD for the  following lab values: ANC < 1500, Hemoglobin < 8, PLT < 100,000,  Creatinine > 1.5, urine output < 200 ml prior to cisplatin, Total Bili > 1.5, ALT & AST > 80. If labs are abnormal OR no lab data is available, or if patient has unstable vital signs: Temperature > 38.5, SBP > 180 or < 90, RR > 30 or HR > 100 MD must be notified and order obtained to begin chemotherapy.            Medication List    STOP taking these medications       HYDROcodone-acetaminophen 5-325 MG per tablet  Commonly known as:  NORCO/VICODIN     predniSONE 10 MG tablet  Commonly known as:  DELTASONE      TAKE these medications       albuterol 108 (90 BASE) MCG/ACT inhaler  Commonly known as:  PROVENTIL HFA;VENTOLIN HFA  Inhale 2 puffs into the lungs every 6 (six) hours as needed for wheezing or shortness of breath.     allopurinol 100 MG tablet  Commonly known as:  ZYLOPRIM  Take 1 tablet (100 mg total) by mouth daily.     aspirin 81 MG tablet  Take 81 mg by mouth daily.     budesonide-formoterol 160-4.5 MCG/ACT inhaler  Commonly known as:  SYMBICORT  Inhale 2 puffs into the lungs 2 (two) times daily.     cetirizine 10 MG tablet  Commonly known as:  ZYRTEC  Take 10 mg by mouth daily.     dexamethasone 4 MG tablet  Commonly known as:  DECADRON  Take 1 tablet (4 mg total) by mouth daily.     diphenoxylate-atropine 2.5-0.025 MG per tablet  Commonly known as:  LOMOTIL  Take 1 tablet by mouth 4 (four) times daily as needed for diarrhea or loose stools.     docusate sodium 100 MG capsule  Commonly known as:  COLACE  Take 1 capsule (100 mg total) by mouth 2 (two) times daily.     feeding supplement (ENSURE COMPLETE) Liqd  Take 237 mLs by mouth 3 (three) times daily with meals.     fluticasone 50 MCG/ACT nasal spray  Commonly known as:  FLONASE  Place 2 sprays into both nostrils daily as needed for allergies or rhinitis.     folic acid 1 MG tablet  Commonly known as:  FOLVITE  Take 1 tablet (1  mg total) by mouth daily.     guaiFENesin 600 MG 12 hr tablet  Commonly known as:  MUCINEX  Take 1,200 mg by mouth 2 (two) times daily as needed.     LORazepam 0.5 MG tablet  Commonly known as:  ATIVAN  Take 1 tablet (0.5 mg total) by mouth every 6 (six) hours as needed (Nausea or vomiting).  mesalamine 1000 MG suppository  Commonly known as:  CANASA  Place 1,000 mg rectally at bedtime.     methocarbamol 750 MG tablet  Commonly known as:  ROBAXIN  Take 1 tablet (750 mg total) by mouth every 8 (eight) hours as needed for muscle spasms.     montelukast 10 MG tablet  Commonly known as:  SINGULAIR  Take 10 mg by mouth at bedtime.     morphine 15 MG tablet  Commonly known as:  MSIR  Take 1 tablet (15 mg total) by mouth every 4 (four) hours as needed for severe pain.     multivitamin with minerals Tabs tablet  Take 1 tablet by mouth daily.     nystatin 100000 UNIT/ML suspension  Commonly known as:  MYCOSTATIN  Take 10 mLs (1,000,000 Units total) by mouth 4 (four) times daily. Use for 5 days then stop.     ondansetron 8 MG tablet  Commonly known as:  ZOFRAN  Take 1 tablet (8 mg total) by mouth 2 (two) times daily. Start the day after chemo for 3 days. Then take as needed for nausea or vomiting.     prochlorperazine 10 MG tablet  Commonly known as:  COMPAZINE  Take 1 tablet (10 mg total) by mouth every 6 (six) hours as needed (Nausea or vomiting).     promethazine 25 MG tablet  Commonly known as:  PHENERGAN  Take 1 tablet (25 mg total) by mouth every 6 (six) hours as needed for nausea or refractory nausea / vomiting.     RABEprazole 20 MG tablet  Commonly known as:  ACIPHEX  Take 20 mg by mouth daily.     thiamine 100 MG tablet  Take 1 tablet (100 mg total) by mouth daily.     traMADol 50 MG tablet  Commonly known as:  ULTRAM  Take 50 mg by mouth every 8 (eight) hours as needed for moderate pain.     VITAMIN D-3 PO  Take 1 capsule by mouth daily.     zolpidem 5  MG tablet  Commonly known as:  AMBIEN  Take 5 mg by mouth at bedtime.       No Known Allergies Follow-up Information   Follow up with [name] Shelling, MD. Schedule an appointment as soon as possible for a visit in 1 week.   Specialty:  Internal Medicine   Contact information:   [address] [place] [address] [phone]       Follow up with Volanda Napoleon, MD. Schedule an appointment as soon as possible for a visit on 12/27/2013. (f/u on Monday)    Specialty:  Oncology   Contact information:   [address], [address] [place] [address] [phone]        The results of significant diagnostics from this hospitalization (including imaging, microbiology, ancillary and laboratory) are listed below for reference.    Significant Diagnostic Studies: Dg Chest 2 View  12/15/2013   CLINICAL DATA:  Short of breath and weakness  EXAM: CHEST  2 VIEW  COMPARISON:  Chest 06/07/2009.  FINDINGS: Heart size and vascularity are normal. Negative for pneumonia or effusion.  Right perihilar linear density most likely scarring but not definitely seen previously.  Chronic right rib fracture.  IMPRESSION: Right perihilar density has the appearance of scarring. Otherwise no acute abnormality.   Electronically Signed   By: Franchot Gallo M.D.   On: 12/15/2013 19:14   Dg Lumbar Spine 2-3 Views  12/06/2013  CLINICAL DATA:  Low back pain.  EXAM: LUMBAR SPINE - 2-3 VIEW  COMPARISON:  None.  FINDINGS: Paraspinal soft tissues are normal. Prior hernia repair. No acute bony abnormality. Diffuse degenerative changes lumbar spine. Approximately 6.5 mm retrolisthesis of L3 on L4 noted. This is most likely degenerative.  IMPRESSION: Diffuse degenerative changes lumbar spine.  No acute abnormality.   Electronically Signed   By: Marcello Moores  Register   On: 12/06/2013 15:53   Ct Head Wo Contrast  12/17/2013   CLINICAL DATA:  Altered mental status  EXAM: CT HEAD WITHOUT CONTRAST  TECHNIQUE:  Contiguous axial images were obtained from the base of the skull through the vertex without intravenous contrast.  COMPARISON:  None.  FINDINGS: Chronic ischemic changes in the periventricular white matter. Mild global atrophy. No mass effect, midline shift, or acute intracranial hemorrhage. There is an air-fluid level in the left maxillary sinus associated with mucosal thickening. Visualize right maxillary sinuses clear. Mastoid air cells on the right are clear. Trace fluid in the dependent left mastoid air cells.  IMPRESSION: There are inflammatory changes in the left maxillary sinus and left mastoid air cells. Chronic ischemic changes and atrophy are noted.   Electronically Signed   By: Maryclare Bean M.D.   On: 12/17/2013 13:51   Ct Angio Chest Pe W/cm &/or Wo Cm  12/15/2013   CLINICAL DATA:  Elevated D-dimer. Shortness of breath. Recent travel.  EXAM: CT ANGIOGRAPHY CHEST WITH CONTRAST  TECHNIQUE: Multidetector CT imaging of the chest was performed using the standard protocol during bolus administration of intravenous contrast. Multiplanar CT image reconstructions and MIPs were obtained to evaluate the vascular anatomy.  CONTRAST:  145mL OMNIPAQUE IOHEXOL 350 MG/ML SOLN  COMPARISON:  01/22/2006  FINDINGS: THORACIC INLET/BODY WALL:  Incidentally and partially visualized larynx notable for enlargement of the left laryngeal ventricle and medialization of the arytenoid.  MEDIASTINUM:  Normal heart size. No pericardial effusion. Extensive coronary atherosclerosis. No acute vascular abnormality, including pulmonary embolism or aortic dissection.  Bulky lymphadenopathy throughout the mediastinum. A conglomerate of left lower peritracheal nodes measures 2.3 cm in short axis. A discrete subcarinal node measures 2.3 cm in short axis. The nodes are relatively hypoenhancing. Upper peritracheal nodes present bilaterally. Possible early spread to the right supraclavicular station which will be better evaluated by PET.  LUNG  WINDOWS:  Consolidative opacities in the medial right lower lobe. Opacity in the medial left lower lobe, measuring 3 cm in diameter, has a lobulated appearance especially on reformatted imaging. Peribronchovascular nodule in the left lower lobe measuring 19 mm in maximal diameter. This nodule compresses the segmental bronchi. Diffuse bronchial wall thickening, with mucoid impaction in the right lower lobe.  UPPER ABDOMEN:  New lobulated liver surface with numerous ill-defined hyperdense nodules. There are at least 2 perirenal nodules around the left upper pole, measuring up to 12 mm. The adrenal glands appear thickened and mildly nodular, without measurable nodule.  OSSEOUS:  No acute fracture.  No suspicious lytic or blastic lesions.  Review of the MIP images confirms the above findings.  IMPRESSION: 1. Negative for pulmonary embolism. 2. 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 Specifically, there is a 3 cm mass like opacity in left lower lobe, bilateral mediastinal adenopathy, numerous liver masses, left perirenal nodules, and possible early adrenal involvement. 3. Airspace disease in the right lower lobe, likely pneumonia. 4. Findings of left vocal cord paralysis.  Correlate with endoscopy.   Electronically Signed   By: Roderic Palau  Watts M.D.   On: 12/15/2013 21:05   Mr Jeri Cos JJ Contrast  12/20/2013   CLINICAL DATA:  Left lower lobe lung mass.  Confusion and weakness.  EXAM: MRI HEAD WITHOUT AND WITH CONTRAST  TECHNIQUE: Multiplanar, multiecho pulse sequences of the brain and surrounding structures were obtained without and with intravenous contrast.  CONTRAST:  63mL MULTIHANCE GADOBENATE DIMEGLUMINE 529 MG/ML IV SOLN  COMPARISON:  CT head without contrast 12/17/2008  FINDINGS: Numerous bilateral cerebral and cerebellar ring-enhancing lesions are present bilaterally. These demonstrate restricted diffusion. There is relatively minimal surrounding  vasogenic edema associated with the lesions. T2 signal is noted along the rim of the lesions. There are several lesions posterior to the atrium of the left lateral ventricle in the left parietal and occipital lobe white matter. A left parietal lesion measures 7 mm. No individual lesions are greater than 10 mm.  Diffusion signal abnormality is associated with the dura as well. There is some heterogeneous enhancement of the dura suggesting metastatic disease.  Minimal periventricular white matter changes are noted otherwise.  Flow is present in the major intracranial arteries. The globes and orbits are intact. A fluid level is present in the left maxillary sinus.  There is no hemorrhage associated with any of the lesions.  IMPRESSION: 1. Numerous bilateral cerebral and cerebellar sub cm peripherally enhancing lesions bilaterally compatible with metastatic disease. 2. Restricted diffusion is associated with nearly all of the lesions. This suggests a high-grade tumor and probably a small cell lung cancer given the chest findings. Restricted diffusion could also be seen with infection, but a much greater degree of vasogenic edema would be expected and patient would likely present with profound mental status changes given the extent of disease. 3. 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 These results were called by telephone at the time of interpretation on 12/20/2013 at 12:33 pm to Dr. Tyrell Antonio, who verbally acknowledged these results.   Electronically Signed   By: Lawrence Santiago M.D.   On: 12/20/2013 12:35   Mr Lumbar Spine Wo Contrast  12/08/2013   CLINICAL DATA:  Low back pain.  EXAM: MRI LUMBAR SPINE WITHOUT CONTRAST  TECHNIQUE: Multiplanar, multisequence MR imaging of the lumbar spine was performed. No intravenous contrast was administered.  COMPARISON:  Radiographs dated 12/06/2013  FINDINGS: Normal conus tip at L1-2.  The paraspinal soft tissues are normal.  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  T11-12 and T12-L1:  Normal.  L1-2:  Tiny broad-based disc bulge with no neural impingement.  L2-3: 4 mm retrolisthesis with a small broad-based disc bulge with protrusion into the inferior aspect of the left neural foramen without mass effect upon the exiting L2 nerve.  L3-4: 5 mm retrolisthesis with a small broad-based bulge of the uncovered disc. Severe right facet arthritis with right foraminal stenosis. Mild to moderate narrowing of the spinal canal. No focal neural impingement.  L4-5: Tiny broad-based disc bulge with no neural impingement. Severe bilateral facet arthritis with moderate left and severe right foraminal stenosis. Slight narrowing of the spinal canal.  L5-S1: Small broad-based disc bulge with no neural impingement. Moderate left facet arthritis. Moderately severe left foraminal stenosis.  IMPRESSION: 1. Severe are right facet arthritis at L3-4 and severe bilateral facet arthritis at L4-5. 2. Severe right foraminal stenosis at L3-4 and L4-5 and left foraminal stenosis at L5-S1. 3. Moderate spinal stenosis at L3-4 and slight spinal stenosis  at L4-5.   Electronically Signed   By: Rozetta Nunnery M.D.   On: 12/08/2013 22:01   US Biopsy  12/20/2013   CLINICAL DATA:  Multiple liver lesions  EXAM: ULTRASOUND-GUIDED BIOPSY OF A LEFT LOBE LIVER LESION.  CORE.  MEDICATIONS AND MEDICAL HISTORY: Versed One mg, Fentanyl 25 mcg.  Additional Medications: None.  ANESTHESIA/SEDATION: Moderate sedation time: 10 minutes  PROCEDURE: The procedure, risks, benefits, and alternatives were explained to the patient. Questions regarding the procedure were encouraged and answered. The patient understands and consents to the procedure.  The epigastrium was prepped with Betadine in a sterile fashion, and a sterile drape was applied covering the operative field. A sterile gown and sterile gloves were used for the procedure.   Under sonographic guidance, an 17gauge guide needle was advanced into the left lobe liver lesion. Subsequently 4 18 gauge core biopsies were obtained. Gel-Foam slurry was injected into the tract with a guide needle removal. Final imaging was performed.  Patient tolerated the procedure well without complication. Vital sign monitoring by nursing staff during the procedure will continue as patient is in the special procedures unit for post procedure observation.  FINDINGS: The images document guide needle placement within the left lobe liver lesion. Post biopsy images demonstrate no hemorrhage.  IMPRESSION: Successful ultrasound-guided core biopsy of a left lobe liver lesion.   Electronically Signed   By: Maryclare Bean M.D.   On: 12/20/2013 15:01   Ir Fluoro Guide Cv Line Right  12/21/2013   CLINICAL DATA:  METASTATIC LUNG CANCER  EXAM: RIGHT IJ SINGLE LUMEN POWER PORT CATHETER INSERTION  Date:  8/11/20158/03/2014 3:57 pm  Radiologist:  M. Daryll Brod, MD  Guidance:  Ultrasound fluoroscopic  FLUOROSCOPY TIME:  36 seconds  MEDICATIONS AND MEDICAL HISTORY: 2 g Ancefadministered within 1 hour of the procedure.1.5 mg Versed, 50 mcg fentanyl  ANESTHESIA/SEDATION: 30 min  CONTRAST:  None.  COMPLICATIONS: None immediate  PROCEDURE: Informed consent was obtained from the patient following explanation of the procedure, risks, benefits and alternatives. The patient understands, agrees and consents for the procedure. All questions were addressed. A time out was performed.  Maximal barrier sterile technique utilized including caps, mask, sterile gowns, sterile gloves, large sterile drape, hand hygiene, and 2% chlorhexidine scrub.  Under sterile conditions and local anesthesia, right internal micropuncture venous access was performed. Access was performed with ultrasound. Images were obtained for documentation. A guide wire was inserted followed by a transitional dilator. This allowed insertion of a guide wire and catheter into the  IVC. Measurements were obtained from the SVC / RA junction back to the right IJ venotomy site. In the right infraclavicular chest, a subcutaneous pocket was created over the second anterior rib. This was done under sterile conditions and local anesthesia. 1% lidocaine with epinephrine was utilized for this. A 2.5 cm incision was made in the skin. Blunt dissection was performed to create a subcutaneous pocket over the right pectoralis major muscle. The pocket was flushed with saline vigorously. There was adequate hemostasis. The port catheter was assembled and checked for leakage. The port catheter was secured in the pocket with two retention sutures. The tubing was tunneled subcutaneously to the Right venotomy site and inserted into the SVC/RA junction through a valved peel-away sheath. Position was confirmed with fluoroscopy. Images were obtained for documentation. The patient tolerated the procedure well. No immediate complications. Incisions were closed in a two layer fashion with 4 - 0 Vicryl suture. Dermabond was applied to the skin. The  port catheter was accessed, blood was aspirated followed by saline and heparin flushes. Needle was removed. A dry sterile dressing was applied.  IMPRESSION: Ultrasound and fluoroscopically guided right internal jugular single lumen power port catheter insertion. Tip in the SVC/RA junction. Catheter ready for use.   Electronically Signed   By: Daryll Brod M.D.   On: 12/21/2013 16:00   Ir US Guide Vasc Access Right  12/21/2013   CLINICAL DATA:  METASTATIC LUNG CANCER  EXAM: RIGHT IJ SINGLE LUMEN POWER PORT CATHETER INSERTION  Date:  8/11/20158/03/2014 3:57 pm  Radiologist:  M. Daryll Brod, MD  Guidance:  Ultrasound fluoroscopic  FLUOROSCOPY TIME:  36 seconds  MEDICATIONS AND MEDICAL HISTORY: 2 g Ancefadministered within 1 hour of the procedure.1.5 mg Versed, 50 mcg fentanyl  ANESTHESIA/SEDATION: 30 min  CONTRAST:  None.  COMPLICATIONS: None immediate  PROCEDURE: Informed  consent was obtained from the patient following explanation of the procedure, risks, benefits and alternatives. The patient understands, agrees and consents for the procedure. All questions were addressed. A time out was performed.  Maximal barrier sterile technique utilized including caps, mask, sterile gowns, sterile gloves, large sterile drape, hand hygiene, and 2% chlorhexidine scrub.  Under sterile conditions and local anesthesia, right internal micropuncture venous access was performed. Access was performed with ultrasound. Images were obtained for documentation. A guide wire was inserted followed by a transitional dilator. This allowed insertion of a guide wire and catheter into the IVC. Measurements were obtained from the SVC / RA junction back to the right IJ venotomy site. In the right infraclavicular chest, a subcutaneous pocket was created over the second anterior rib. This was done under sterile conditions and local anesthesia. 1% lidocaine with epinephrine was utilized for this. A 2.5 cm incision was made in the skin. Blunt dissection was performed to create a subcutaneous pocket over the right pectoralis major muscle. The pocket was flushed with saline vigorously. There was adequate hemostasis. The port catheter was assembled and checked for leakage. The port catheter was secured in the pocket with two retention sutures. The tubing was tunneled subcutaneously to the Right venotomy site and inserted into the SVC/RA junction through a valved peel-away sheath. Position was confirmed with fluoroscopy. Images were obtained for documentation. The patient tolerated the procedure well. No immediate complications. Incisions were closed in a two layer fashion with 4 - 0 Vicryl suture. Dermabond was applied to the skin. The port catheter was accessed, blood was aspirated followed by saline and heparin flushes. Needle was removed. A dry sterile dressing was applied.  IMPRESSION: Ultrasound and fluoroscopically  guided right internal jugular single lumen power port catheter insertion. Tip in the SVC/RA junction. Catheter ready for use.   Electronically Signed   By: Daryll Brod M.D.   On: 12/21/2013 16:00   US Abdomen Limited Ruq  12/19/2013   CLINICAL DATA:  68 year old male with elevated LFTs.  EXAM: US ABDOMEN LIMITED - RIGHT UPPER QUADRANT  COMPARISON:  History of lung mass.  FINDINGS: Gallbladder:  Gallbladder sludge and gallbladder polyps are noted. No definite cholelithiasis or evidence of acute cholecystitis identified.  Common bile duct:  Diameter: 4 mm. There is no evidence of intrahepatic or extrahepatic biliary dilatation.  Liver:  The liver is heterogeneous with the suggestion of multiple hepatic masses. The largest apparent mass measures 2.3 x 1.9 x 3.1 cm in the left lobe. A slightly irregular ectatic contour were may represent cirrhosis.  A 1 x 1.8 cm hyperechoic structure within the IVC at  the level of the upper liver could represent bland or tumor thrombus.  IMPRESSION: Heterogeneous liver with suggestion of multiple hepatic masses and possible cirrhosis. CT or MRI with contrast is recommended for further evaluation.  1 x 1.8 cm apparent filling defect within the IVC at the level of the upper liver, question bland or tumor thrombus.  Gallbladder sludge and polyps.   Electronically Signed   By: Hassan Rowan M.D.   On: 12/19/2013 08:52    Microbiology: Recent Results (from the past 240 hour(s))  MRSA PCR SCREENING     Status: None   Collection Time    12/17/13  5:00 PM      Result Value Ref Range Status   MRSA by PCR NEGATIVE  NEGATIVE Final   Comment:            The GeneXpert MRSA Assay (FDA     approved for NASAL specimens     only), is one component of a     comprehensive MRSA colonization     surveillance program. It is not     intended to diagnose MRSA     infection nor to guide or     monitor treatment for     MRSA infections.     Labs: Basic Metabolic Panel:  Recent Labs Lab  12/21/13 1117 12/22/13 0340 12/23/13 0412 12/24/13 0500 12/25/13 0529  NA 127* 127* 128* 133* 130*  K 3.9 4.2 4.0 4.0 3.8  CL 91* 93* 94* 97 94*  CO2 21 19 20 22 22   GLUCOSE 121* 118* 116* 95 97  BUN 11 11 9 10 11   CREATININE 0.51 0.55 0.51 0.47* 0.48*  CALCIUM 9.2 9.1 9.0 8.6 8.7   Liver Function Tests:  Recent Labs Lab 12/22/13 0340 12/23/13 0412 12/25/13 0529  AST 135* 195* 213*  ALT 148* 195* 223*  ALKPHOS 716* 871* 771*  BILITOT 1.6* 1.8* 1.9*  PROT 5.6* 6.0 5.5*  ALBUMIN 2.5* 2.7* 2.6*   No results found for this basename: LIPASE, AMYLASE,  in the last 168 hours No results found for this basename: AMMONIA,  in the last 168 hours CBC:  Recent Labs Lab 12/20/13 0008 12/22/13 0340 12/23/13 0412 12/24/13 0500 12/25/13 0529  WBC 7.3 7.7 10.8* 9.1 7.0  HGB 11.3* 10.3* 10.4* 8.9* 9.3*  HCT 31.7* 28.4* 28.7* 26.0* 26.2*  MCV 90.1 90.2 91.1 92.9 92.3  PLT 126* 108* 125* 108* 102*   Cardiac Enzymes: No results found for this basename: CKTOTAL, CKMB, CKMBINDEX, TROPONINI,  in the last 168 hours BNP: BNP (last 3 results)  Recent Labs  12/15/13 1750  PROBNP 192.0*   CBG: No results found for this basename: GLUCAP,  in the last 168 hours     Signed:  [facility] MD [facility] 12/25/2013, 9:51 AM

## 2013-12-25 NOTE — Progress Notes (Signed)
[name] [name]   DOB:[birth year]   VZ#:[id number]   JQG#:[id number]  Subjective: Nurse and daughter [name] [name] is at bedside.  Given phenergan this am with improvement in his nausea.  Also  On compazine and zofran.  No acute events noted overnight.    Objective:  Filed Vitals:   12/25/13 0531  BP: 148/80  Pulse:   Temp:   Resp:     Body mass index is 22.39 kg/(m^2).  Intake/Output Summary (Last 24 hours) at 12/25/13 0949 Last data filed at 12/25/13 0700  Gross per 24 hour  Intake   2632 ml  Output      0 ml  Net   2632 ml    GENERAL:alert, no distress and comfortable  SKIN: skin color, texture, turgor are normal, no rashes or significant lesions  EYES: normal, conjunctiva are pink and non-injected, sclera clear  OROPHARYNX:no exudate, no erythema and lips, buccal mucosa, and tongue normal  LUNGS: clear to auscultation and percussion with normal breathing effort  HEART: regular rate & rhythm and no murmurs and no lower extremity edema  ABDOMEN:abdomen soft, non-tender and normal bowel sounds  Musculoskeletal:no cyanosis of digits and no clubbing  NEURO: no focal motor/sensory deficits  Labs:  Lab Results  Component Value Date   WBC 7.0 12/25/2013   HGB 9.3* 12/25/2013   HCT 26.2* 12/25/2013   MCV 92.3 12/25/2013   PLT 102* 12/25/2013   NEUTROABS 2.0 02/21/2010     Basic Metabolic Panel:  Recent Labs Lab 12/21/13 1117 12/22/13 0340 12/23/13 0412 12/24/13 0500 12/25/13 0529  NA 127* 127* 128* 133* 130*  K 3.9 4.2 4.0 4.0 3.8  CL 91* 93* 94* 97 94*  CO2 21 19 20 22 22   GLUCOSE 121* 118* 116* 95 97  BUN 11 11 9 10 11   CREATININE 0.51 0.55 0.51 0.47* 0.48*  CALCIUM 9.2 9.1 9.0 8.6 8.7   GFR Estimated Creatinine Clearance: 94.9 ml/min (by C-G formula based on Cr of 0.48). Liver Function Tests:  Recent Labs Lab 12/22/13 0340 12/23/13 0412 12/25/13 0529  AST 135* 195* 213*  ALT 148* 195* 223*  ALKPHOS 716* 871* 771*  BILITOT 1.6* 1.8* 1.9*  PROT 5.6* 6.0 5.5*   ALBUMIN 2.5* 2.7* 2.6*   Coagulation profile  Recent Labs Lab 12/20/13 1230  INR 1.07    CBC:  Recent Labs Lab 12/20/13 0008 12/22/13 0340 12/23/13 0412 12/24/13 0500 12/25/13 0529  WBC 7.3 7.7 10.8* 9.1 7.0  HGB 11.3* 10.3* 10.4* 8.9* 9.3*  HCT 31.7* 28.4* 28.7* 26.0* 26.2*  MCV 90.1 90.2 91.1 92.9 92.3  PLT 126* 108* 125* 108* 102*   Anemia work up  Recent Labs  12/24/13 1140  VITAMINB12 1363*  FOLATE >20.0  FERRITIN 4008*  TIBC 265  IRON 212*   Microbiology Recent Results (from the past 240 hour(s))  MRSA PCR SCREENING     Status: None   Collection Time    12/17/13  5:00 PM      Result Value Ref Range Status   MRSA by PCR NEGATIVE  NEGATIVE Final   Comment:            The GeneXpert MRSA Assay (FDA     approved for NASAL specimens     only), is one component of a     comprehensive MRSA colonization     surveillance program. It is not     intended to diagnose MRSA     infection nor to guide or     monitor  treatment for     MRSA infections.   PATHOLOGY:  Diagnosis 12/20/2013 Liver, needle/core biopsy, left lobe - HIGH GRADE POORLY DIFFERENTIATED NEUROENDOCRINE CARCINOMA, SMALL CELL TYPE, SEE COMMENT. Microscopic Comment The tumor demonstrates the following immunophenotype: Cytokeratin AE1/3 - diffuse mild to moderate expression. CD56 - strong diffuse expression. Chromogranin - strong diffuse expression. TTF-1 - strong diffuse expression CDX2 - negative expression. 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 The case was reviewed with Dr [name] [name] who concurs. (CRR:ecj/gt 12/21/2013) [name] RUND DO Pathologist, Electronic Signature (Case signed 12/22/2013)   Studies:  No results found.  Assessment/Plan: 68 y.o. 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   --MRI of 08/10 consistent with bilateral cerebral/cerebellar lesions --S/p Liver biopsy on 08/10 consistent with above  2. S/p Port-a-cath on 12/21/2012  3. Anemia of chronic disease. --Likely secondary to #1.  Hgb 9.3.   4. Thrombocytopenia NOS.  --Likely secondary to low grade DIC (malignancy) plus/minus chemotherapy.  Plts, 102.   5. High risk for tumor lysis syndrome. --Continue allopurinol and hydration. Creatinine and potassium stable.   6. Elevated Transaminases secondary to #1. --Continue treatment for #1 as outlined above.   7. Encephalopathy due to #1. --Will need WB XRT as an outpatient.   8. Hyponatremia secondary to #1.  --Na 130 today.   9. Malnutrition secondary to #1. --Prealbumin is 11.2.  He will require a nutrition consult as an outpatient.   10. Nausea secondary to #1. --Compazine and zofran and phernegan prn.  Please discharge on anti-emetics.   11. Disposition.  --Full [name].  Ok to go home from oncology standpoint with follow up with Dr. Marin [name] on 12/27/2013. Neulasta as an outpatient.   Thanks for taking excellent care for [name] [name].     [name] Baskins, MD 12/25/2013  9:49 AM

## 2013-12-27 ENCOUNTER — Other Ambulatory Visit: Payer: Self-pay | Admitting: Hematology & Oncology

## 2013-12-27 ENCOUNTER — Ambulatory Visit (HOSPITAL_BASED_OUTPATIENT_CLINIC_OR_DEPARTMENT_OTHER): Payer: BC Managed Care – PPO

## 2013-12-27 ENCOUNTER — Telehealth: Payer: Self-pay | Admitting: Hematology & Oncology

## 2013-12-27 VITALS — BP 139/96 | Temp 98.1°F

## 2013-12-27 DIAGNOSIS — R918 Other nonspecific abnormal finding of lung field: Secondary | ICD-10-CM

## 2013-12-27 DIAGNOSIS — C3490 Malignant neoplasm of unspecified part of unspecified bronchus or lung: Secondary | ICD-10-CM

## 2013-12-27 MED ORDER — PEGFILGRASTIM INJECTION 6 MG/0.6ML
6.0000 mg | Freq: Once | SUBCUTANEOUS | Status: AC
  Administered 2013-12-27: 6 mg via SUBCUTANEOUS
  Filled 2013-12-27: qty 0.6

## 2013-12-27 NOTE — Telephone Encounter (Signed)
Pt, [name] [name] (inj room) and Baxter Flattery (precert) aware of 1-64 injection. Pt is also aware of 8-21 MD appointments

## 2013-12-27 NOTE — Telephone Encounter (Signed)
[employer] - NPR   [id number] pegfilgrastim (NEULASTA) injection 6 mg  Small cell carcinoma of lung, left 162.9  Efc: 03/13/2013   I spoke w Ernst Breach on today 12/27/2013  Ref: [id number]

## 2013-12-27 NOTE — Patient Instructions (Signed)
Pegfilgrastim injection What is this medicine? PEGFILGRASTIM (peg fil GRA stim) is a long-acting granulocyte colony-stimulating factor that stimulates the growth of neutrophils, a type of white blood cell important in the body's fight against infection. It is used to reduce the incidence of fever and infection in patients with certain types of cancer who are receiving chemotherapy that affects the bone marrow. This medicine may be used for other purposes; ask your health care provider or pharmacist if you have questions. COMMON BRAND NAME(S): Neulasta What should I tell my health care provider before I take this medicine? They need to know if you have any of these conditions: -latex allergy -ongoing radiation therapy -sickle cell disease -skin reactions to acrylic adhesives (On-Body Injector only) -an unusual or allergic reaction to pegfilgrastim, filgrastim, other medicines, foods, dyes, or preservatives -pregnant or trying to get pregnant -breast-feeding How should I use this medicine? This medicine is for injection under the skin. If you get this medicine at home, you will be taught how to prepare and give the pre-filled syringe or how to use the On-body Injector. Refer to the patient Instructions for Use for detailed instructions. Use exactly as directed. Take your medicine at regular intervals. Do not take your medicine more often than directed. It is important that you put your used needles and syringes in a special sharps container. Do not put them in a trash can. If you do not have a sharps container, call your pharmacist or healthcare provider to get one. Talk to your pediatrician regarding the use of this medicine in children. Special care may be needed. Overdosage: If you think you have taken too much of this medicine contact a poison control center or emergency room at once. NOTE: This medicine is only for you. Do not share this medicine with others. What if I miss a dose? It is  important not to miss your dose. Call your doctor or health care professional if you miss your dose. If you miss a dose due to an On-body Injector failure or leakage, a new dose should be administered as soon as possible using a single prefilled syringe for manual use. What may interact with this medicine? Interactions have not been studied. Give your health care provider a list of all the medicines, herbs, non-prescription drugs, or dietary supplements you use. Also tell them if you smoke, drink alcohol, or use illegal drugs. Some items may interact with your medicine. This list may not describe all possible interactions. Give your health care provider a list of all the medicines, herbs, non-prescription drugs, or dietary supplements you use. Also tell them if you smoke, drink alcohol, or use illegal drugs. Some items may interact with your medicine. What should I watch for while using this medicine? You may need blood work done while you are taking this medicine. If you are going to need a MRI, CT scan, or other procedure, tell your doctor that you are using this medicine (On-Body Injector only). What side effects may I notice from receiving this medicine? Side effects that you should report to your doctor or health care professional as soon as possible: -allergic reactions like skin rash, itching or hives, swelling of the face, lips, or tongue -dizziness -fever -pain, redness, or irritation at site where injected -pinpoint red spots on the skin -shortness of breath or breathing problems -stomach or side pain, or pain at the shoulder -swelling -tiredness -trouble passing urine Side effects that usually do not require medical attention (report to your doctor   or health care professional if they continue or are bothersome): -bone pain -muscle pain This list may not describe all possible side effects. Call your doctor for medical advice about side effects. You may report side effects to FDA at  1-800-FDA-1088. Where should I keep my medicine? Keep out of the reach of children. Store pre-filled syringes in a refrigerator between 2 and 8 degrees C (36 and 46 degrees F). Do not freeze. Keep in carton to protect from light. Throw away this medicine if it is left out of the refrigerator for more than 48 hours. Throw away any unused medicine after the expiration date. NOTE: This sheet is a summary. It may not cover all possible information. If you have questions about this medicine, talk to your doctor, pharmacist, or health care provider.  2015, Elsevier/Gold Standard. (2013-07-29 16:14:05)  

## 2013-12-31 ENCOUNTER — Encounter (HOSPITAL_COMMUNITY): Payer: Self-pay | Admitting: Emergency Medicine

## 2013-12-31 ENCOUNTER — Emergency Department (HOSPITAL_COMMUNITY)
Admission: EM | Admit: 2013-12-31 | Discharge: 2014-01-01 | Disposition: A | Discharge status: Home / Self Care | Payer: BC Managed Care – PPO | Attending: Emergency Medicine | Admitting: Emergency Medicine

## 2013-12-31 ENCOUNTER — Other Ambulatory Visit (HOSPITAL_BASED_OUTPATIENT_CLINIC_OR_DEPARTMENT_OTHER): Payer: BC Managed Care – PPO | Admitting: Lab

## 2013-12-31 ENCOUNTER — Ambulatory Visit (HOSPITAL_BASED_OUTPATIENT_CLINIC_OR_DEPARTMENT_OTHER): Payer: BC Managed Care – PPO | Admitting: Hematology & Oncology

## 2013-12-31 ENCOUNTER — Encounter: Payer: Self-pay | Admitting: Hematology & Oncology

## 2013-12-31 ENCOUNTER — Emergency Department (HOSPITAL_COMMUNITY): Payer: BC Managed Care – PPO

## 2013-12-31 VITALS — BP 122/77 | HR 89 | Temp 97.9°F | Resp 18 | Wt 145.0 lb

## 2013-12-31 DIAGNOSIS — J45909 Unspecified asthma, uncomplicated: Secondary | ICD-10-CM | POA: Diagnosis not present

## 2013-12-31 DIAGNOSIS — E785 Hyperlipidemia, unspecified: Secondary | ICD-10-CM | POA: Diagnosis not present

## 2013-12-31 DIAGNOSIS — C3491 Malignant neoplasm of unspecified part of right bronchus or lung: Secondary | ICD-10-CM

## 2013-12-31 DIAGNOSIS — K5900 Constipation, unspecified: Secondary | ICD-10-CM | POA: Insufficient documentation

## 2013-12-31 DIAGNOSIS — K219 Gastro-esophageal reflux disease without esophagitis: Secondary | ICD-10-CM | POA: Insufficient documentation

## 2013-12-31 DIAGNOSIS — Z85828 Personal history of other malignant neoplasm of skin: Secondary | ICD-10-CM | POA: Insufficient documentation

## 2013-12-31 DIAGNOSIS — Z860100 Personal history of colon polyps, unspecified: Secondary | ICD-10-CM | POA: Insufficient documentation

## 2013-12-31 DIAGNOSIS — R7989 Other specified abnormal findings of blood chemistry: Secondary | ICD-10-CM

## 2013-12-31 DIAGNOSIS — R109 Unspecified abdominal pain: Secondary | ICD-10-CM | POA: Insufficient documentation

## 2013-12-31 DIAGNOSIS — C7800 Secondary malignant neoplasm of unspecified lung: Secondary | ICD-10-CM | POA: Insufficient documentation

## 2013-12-31 DIAGNOSIS — Z87891 Personal history of nicotine dependence: Secondary | ICD-10-CM | POA: Insufficient documentation

## 2013-12-31 DIAGNOSIS — Z79899 Other long term (current) drug therapy: Secondary | ICD-10-CM | POA: Diagnosis not present

## 2013-12-31 DIAGNOSIS — R945 Abnormal results of liver function studies: Secondary | ICD-10-CM

## 2013-12-31 DIAGNOSIS — R64 Cachexia: Secondary | ICD-10-CM

## 2013-12-31 DIAGNOSIS — C3490 Malignant neoplasm of unspecified part of unspecified bronchus or lung: Secondary | ICD-10-CM

## 2013-12-31 DIAGNOSIS — IMO0002 Reserved for concepts with insufficient information to code with codable children: Secondary | ICD-10-CM | POA: Insufficient documentation

## 2013-12-31 LAB — CBC WITH DIFFERENTIAL (CANCER CENTER ONLY)
BASO#: 0.1 10*3/uL (ref 0.0–0.2)
BASO%: 0.4 % (ref 0.0–2.0)
EOS%: 0.3 % (ref 0.0–7.0)
Eosinophils Absolute: 0 10*3/uL (ref 0.0–0.5)
HCT: 27.5 % — ABNORMAL LOW (ref 38.7–49.9)
HGB: 9.7 g/dL — ABNORMAL LOW (ref 13.0–17.1)
LYMPH#: 4.6 10*3/uL — AB (ref 0.9–3.3)
LYMPH%: 29.3 % (ref 14.0–48.0)
MCH: 33.7 pg — ABNORMAL HIGH (ref 28.0–33.4)
MCHC: 35.3 g/dL (ref 32.0–35.9)
MCV: 96 fL (ref 82–98)
MONO#: 2.6 10*3/uL — ABNORMAL HIGH (ref 0.1–0.9)
MONO%: 16.6 % — ABNORMAL HIGH (ref 0.0–13.0)
NEUT%: 53.4 % (ref 40.0–80.0)
NEUTROS ABS: 8.4 10*3/uL — AB (ref 1.5–6.5)
Platelets: 107 10*3/uL — ABNORMAL LOW (ref 145–400)
RBC: 2.88 10*6/uL — ABNORMAL LOW (ref 4.20–5.70)
RDW: 13.2 % (ref 11.1–15.7)
WBC: 15.7 10*3/uL — ABNORMAL HIGH (ref 4.0–10.0)

## 2013-12-31 LAB — CMP (CANCER CENTER ONLY)
ALBUMIN: 2.9 g/dL — AB (ref 3.3–5.5)
ALK PHOS: 670 U/L — AB (ref 26–84)
ALT(SGPT): 107 U/L — ABNORMAL HIGH (ref 10–47)
AST: 76 U/L — AB (ref 11–38)
BUN: 16 mg/dL (ref 7–22)
CO2: 22 mEq/L (ref 18–33)
Calcium: 8.7 mg/dL (ref 8.0–10.3)
Chloride: 99 mEq/L (ref 98–108)
Creat: 0.6 mg/dl (ref 0.6–1.2)
Glucose, Bld: 104 mg/dL (ref 73–118)
POTASSIUM: 4.2 meq/L (ref 3.3–4.7)
SODIUM: 139 meq/L (ref 128–145)
Total Bilirubin: 1.5 mg/dl (ref 0.20–1.60)
Total Protein: 6.3 g/dL — ABNORMAL LOW (ref 6.4–8.1)

## 2013-12-31 LAB — TECHNOLOGIST REVIEW CHCC SATELLITE: Tech Review: 16

## 2013-12-31 LAB — LACTATE DEHYDROGENASE: LDH: 1080 U/L — ABNORMAL HIGH (ref 94–250)

## 2013-12-31 MED ORDER — DRONABINOL 2.5 MG PO CAPS
ORAL_CAPSULE | ORAL | Status: AC
Start: 2013-12-31 — End: ?

## 2013-12-31 MED ORDER — FLUCONAZOLE 100 MG PO TABS: 100.0000 mg | ORAL_TABLET | Freq: Every day | ORAL | Status: AC

## 2013-12-31 NOTE — ED Notes (Signed)
Patient transported to X-ray 

## 2013-12-31 NOTE — ED Notes (Signed)
Pt reports rectal pain since 4pm. Started lung Ca treatment a week ago. Pt believes he has a bowel obstruction. No BM x1 week. Has tried suppository and go smoothly tea without effect.

## 2013-12-31 NOTE — Progress Notes (Signed)
Hematology and Oncology Follow Up Visit  [name] [name] [id number] 1946/01/20 68 y.o. 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 is back for followup. This is his first office visit. I saw in consultation at the hospital. He presented with SIADH. He is out of have mediastinal adenopathy. He had extensive liver metastasis. He had brain metastasis. He had a liver biopsy. The pathology report showed small cell lung cancer.  He had a Port-A-Cath placed. We started him on chemotherapy in the hospital. He does first dose of chemotherapy on August 20.  At home, he's been doing okay. He has some nausea with chemotherapy. He is on a low bit better. He will be put on some Marinol to try to help with his appetite.  We had to dose reduce his chemotherapy significantly because of his liver functions which were declining quickly.  He has had no cough. He's had no headache. He's had no change in bowel or bladder habits.  Does have a candida fungal rash in the inguinal region.  He is walking a little bit. He is getting some physical therapy at home. He had lost quite a bit of weight before he started his treatments.  Currently, his performance status is ECOG 2  Medications: Current outpatient prescriptions:guaiFENesin (MUCINEX) 600 MG 12 hr tablet, Take 1,200 mg by mouth 2 (two) times daily as needed. , Disp: , Rfl: ;  prochlorperazine (COMPAZINE) 10 MG tablet, Take 1 tablet (10 mg total) by mouth every 6 (six) hours as needed (Nausea or vomiting)., Disp: 30 tablet, Rfl: 1;  allopurinol (ZYLOPRIM) 100 MG tablet, Take 1 tablet (100 mg total) by mouth daily., Disp: 30 tablet, Rfl: 0 budesonide-formoterol (SYMBICORT) 160-4.5 MCG/ACT inhaler, Inhale 2 puffs into the lungs 2 (two) times daily., Disp: , Rfl: ;   cetirizine (ZYRTEC) 10 MG tablet, Take 10 mg by mouth daily.  , Disp: , Rfl: ;  Cholecalciferol (VITAMIN D-3 PO), Take 1 capsule by mouth daily., Disp: , Rfl: ;  dexamethasone (DECADRON) 4 MG tablet, Take 1 tablet (4 mg total) by mouth daily., Disp: 31 tablet, Rfl: 0 diphenoxylate-atropine (LOMOTIL) 2.5-0.025 MG per tablet, Take 1 tablet by mouth 4 (four) times daily as needed for diarrhea or loose stools., Disp: , Rfl: ;  docusate sodium (COLACE) 100 MG capsule, Take 1 capsule (100 mg total) by mouth 2 (two) times daily., Disp: 60 capsule, Rfl: 0;  dronabinol (MARINOL) 2.5 MG capsule, Take 1 capsule 3 times a day, Disp: 90 capsule, Rfl: 0 feeding supplement, ENSURE COMPLETE, (ENSURE COMPLETE) LIQD, Take 237 mLs by mouth 3 (three) times daily with meals., Disp: , Rfl: ;  fluconazole (DIFLUCAN) 100 MG tablet, Take 1 tablet (100 mg total) by mouth daily., Disp: 14 tablet, Rfl: 1;  fluticasone (FLONASE) 50 MCG/ACT nasal spray, Place 2 sprays into both nostrils daily as needed for allergies or rhinitis., Disp: , Rfl:  folic acid (FOLVITE) 1 MG tablet, Take 1 tablet (1 mg total) by mouth daily., Disp: 31 tablet, Rfl: 0;  methocarbamol (ROBAXIN) 750 MG tablet, Take 1 tablet (750 mg total) by mouth every 8 (eight) hours as needed for muscle spasms., Disp: 30 tablet, Rfl: 0;  montelukast (SINGULAIR) 10 MG tablet, Take 10 mg by mouth at bedtime., Disp: , Rfl:  morphine (  MSIR) 15 MG tablet, Take 1 tablet (15 mg total) by mouth every 4 (four) hours as needed for severe pain., Disp: 30 tablet, Rfl: 0;  nystatin (MYCOSTATIN) 100000 UNIT/ML suspension, Take 10 mLs (1,000,000 Units total) by mouth 4 (four) times daily. Use for 5 days then stop., Disp: 240 mL, Rfl: 0 ondansetron (ZOFRAN) 8 MG tablet, Take 1 tablet (8 mg total) by mouth 2 (two) times daily. Start the day after chemo for 3 days. Then take as needed for nausea or vomiting., Disp: 30 tablet, Rfl: 1;  promethazine (PHENERGAN) 25 MG tablet, Take 1 tablet (25 mg  total) by mouth every 6 (six) hours as needed for nausea or refractory nausea / vomiting., Disp: 30 tablet, Rfl: 0 RABEprazole (ACIPHEX) 20 MG tablet, Take 20 mg by mouth daily., Disp: , Rfl: ;  thiamine 100 MG tablet, Take 1 tablet (100 mg total) by mouth daily., Disp: , Rfl:   Allergies: No Known Allergies  Past Medical History, Surgical history, Social history, and Family History were reviewed and updated.  Review of Systems: As above  Physical Exam:  weight is 145 lb (65.772 kg). His oral temperature is 97.9 F (36.6 C). His blood pressure is 122/77 and his pulse is 89. His respiration is 18.   Thin white gentleman. He is alert and oriented. Head and neck exam shows no scleral icterus. Oral mucosa is slightly dry. There is no adenopathy on his neck. He does have some temporal muscle wasting. Lungs are clear bilaterally. Cardiac exam is regular rate and rhythm with no murmurs rubs or bruits. Abdomen is soft. There is no fluid wave. Liver edge is at the right costal margin. There is no splenomegaly. Back exam no tenderness over the spine ribs or hips. Inguinal region does show a fungal rash in the bilateral inguinal area. Extremities shows muscle atrophy in upper and lower extremities. He has decent range of motion of his joints. Skin exam shows some scattered ecchymoses. Neurological exam shows no focal deficits.  Lab Results  Component Value Date   WBC 15.7* 12/31/2013   HGB 9.7* 12/31/2013   HCT 27.5* 12/31/2013   MCV 96 12/31/2013   PLT 107* 12/31/2013     Chemistry      Component Value Date/Time   NA 139 12/31/2013 1404   NA 130* 12/25/2013 0529   K 4.2 12/31/2013 1404   K 3.8 12/25/2013 0529   CL 99 12/31/2013 1404   CL 94* 12/25/2013 0529   CO2 22 12/31/2013 1404   CO2 22 12/25/2013 0529   BUN 16 12/31/2013 1404   BUN 11 12/25/2013 0529   CREATININE 0.6 12/31/2013 1404   CREATININE 0.48* 12/25/2013 0529      Component Value Date/Time   CALCIUM 8.7 12/31/2013 1404   CALCIUM 8.7  12/25/2013 0529   ALKPHOS 670* 12/31/2013 1404   ALKPHOS 771* 12/25/2013 0529   AST 76* 12/31/2013 1404   AST 213* 12/25/2013 0529   ALT 107* 12/31/2013 1404   ALT 223* 12/25/2013 0529   BILITOT 1.50 12/31/2013 1404   BILITOT 1.9* 12/25/2013 0529         Impression and Plan: [name] is a 68 year old coming with extensive stage small cell lung cancer. He's had his first cycle of chemotherapy. His liver function tests have improved. I don't see any evidence of tumor lysis. His sodium has normalized.  We still have a long way to go. I'm just thankful that he is improving somewhat with his liver tests.  We just have to get his nutrition better. Hopefully the Marinol will help with this.  We will go ahead and plan to get him back to see me in another 10 days. I probably will start his second cycle of treatment on September 16.  Again, we still have a long way to go. Stable as white cell count is still doing okay. I suppose it may start to go down over the weekend. He goes to watch out for any, temperature over am 101. I told him that he cannot use any suppositories.  We spent about 45 minutes with him and his wife today. This is definitely a slow process Volanda Napoleon, MD 8/21/20154:11 PM

## 2014-01-01 LAB — CBC WITH DIFFERENTIAL/PLATELET
BASOS PCT: 2 % — AB (ref 0–1)
Basophils Absolute: 0.2 10*3/uL — ABNORMAL HIGH (ref 0.0–0.1)
EOS PCT: 0 % (ref 0–5)
Eosinophils Absolute: 0 10*3/uL (ref 0.0–0.7)
HCT: 25.3 % — ABNORMAL LOW (ref 39.0–52.0)
Hemoglobin: 8.8 g/dL — ABNORMAL LOW (ref 13.0–17.0)
LYMPHS ABS: 3.2 10*3/uL (ref 0.7–4.0)
Lymphocytes Relative: 26 % (ref 12–46)
MCH: 32.2 pg (ref 26.0–34.0)
MCHC: 34.8 g/dL (ref 30.0–36.0)
MCV: 92.7 fL (ref 78.0–100.0)
MONO ABS: 2.8 10*3/uL — AB (ref 0.1–1.0)
Monocytes Relative: 23 % — ABNORMAL HIGH (ref 3–12)
Neutro Abs: 6 10*3/uL (ref 1.7–7.7)
Neutrophils Relative %: 49 % (ref 43–77)
PLATELETS: 100 10*3/uL — AB (ref 150–400)
RBC: 2.73 MIL/uL — AB (ref 4.22–5.81)
RDW: 13.7 % (ref 11.5–15.5)
WBC: 12.2 10*3/uL — ABNORMAL HIGH (ref 4.0–10.5)

## 2014-01-01 LAB — COMPREHENSIVE METABOLIC PANEL
ALK PHOS: 680 U/L — AB (ref 39–117)
ALT: 103 U/L — ABNORMAL HIGH (ref 0–53)
ANION GAP: 18 — AB (ref 5–15)
AST: 72 U/L — ABNORMAL HIGH (ref 0–37)
Albumin: 2.9 g/dL — ABNORMAL LOW (ref 3.5–5.2)
BILIRUBIN TOTAL: 1.3 mg/dL — AB (ref 0.3–1.2)
BUN: 21 mg/dL (ref 6–23)
CHLORIDE: 96 meq/L (ref 96–112)
CO2: 19 mEq/L (ref 19–32)
Calcium: 9.4 mg/dL (ref 8.4–10.5)
Creatinine, Ser: 0.85 mg/dL (ref 0.50–1.35)
GFR calc non Af Amer: 88 mL/min — ABNORMAL LOW (ref >90)
GLUCOSE: 124 mg/dL — AB (ref 70–99)
POTASSIUM: 3.8 meq/L (ref 3.7–5.3)
Sodium: 133 mEq/L — ABNORMAL LOW (ref 137–147)
Total Protein: 5.9 g/dL — ABNORMAL LOW (ref 6.0–8.3)

## 2014-01-01 MED ORDER — FLEET ENEMA 7-19 GM/118ML RE ENEM
1.0000 | ENEMA | Freq: Once | RECTAL | Status: AC
  Administered 2014-01-01: 1 via RECTAL
  Filled 2014-01-01: qty 1

## 2014-01-01 MED ORDER — POLYETHYLENE GLYCOL 3350 17 G PO PACK: 17.0000 g | PACK | Freq: Every day | ORAL | Status: AC

## 2014-01-01 MED ORDER — DOCUSATE SODIUM 100 MG PO CAPS: 100.0000 mg | ORAL_CAPSULE | Freq: Two times a day (BID) | ORAL | Status: AC

## 2014-01-01 MED ORDER — HYDROCODONE-ACETAMINOPHEN 5-325 MG PO TABS
1.0000 | ORAL_TABLET | Freq: Once | ORAL | Status: AC
  Administered 2014-01-01: 1 via ORAL
  Filled 2014-01-01: qty 1

## 2014-01-01 NOTE — ED Notes (Signed)
Pt got up to go to bathroom, reports having "a very good bowel movement."

## 2014-01-01 NOTE — Discharge Instructions (Signed)

## 2014-01-01 NOTE — ED Provider Notes (Signed)
CSN: [id number]     Arrival date & time 12/31/13  2301 History   First MD Initiated Contact with Patient 01/01/14 0004     Chief Complaint  Patient presents with  . Abdominal Pain     (Consider location/radiation/quality/duration/timing/severity/associated sxs/prior Treatment) HPI Patient with a history of metastatic lung cancer presents with constipation for the past week. He states he is passing gas. He has tried suppositories and home remedies without effect. He denies any abdominal pain. He's had no fever or chills. He denies any nausea or vomiting. Past Medical History  Diagnosis Date  . Asthma   . Other and unspecified hyperlipidemia   . GERD (gastroesophageal reflux disease)   . Hypercalcemia   . Skin cancer   . Colitis 2012    Colonoscopy  . External hemorrhoids without mention of complication 6301    Colonoscopy   . Diverticulosis of colon (without mention of hemorrhage) 2008    Colonoscopy  . History of colon polyps 2008    Colonoscopy  . Hiatal hernia 2008    EGD   . 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 Rectal surgery  2004    fistula repair  . Vasectomy    . Inguinal hernia repair     Family History  Problem Relation Age of Onset  . Heart disease Father   . Alzheimer's disease Mother    History  Substance Use Topics  . Smoking status: Former Smoker -- 2.00 packs/day for 28 years    Types: Cigarettes    Start date: 08/01/1962    Quit date: 05/13/1990  . Smokeless tobacco: Never Used     Comment: quit smoking 24 years ago  . Alcohol Use: 3.0 oz/week    5 Cans of beer per week     Comment: Social    Review of Systems  Constitutional: Negative for fever and chills.  Respiratory: Negative for shortness of breath.   Cardiovascular: Negative for chest pain.  Gastrointestinal: Positive for constipation. Negative for nausea, vomiting, abdominal pain, diarrhea, blood in stool, abdominal distention  and rectal pain.  Genitourinary: Negative for dysuria, frequency and flank pain.  Musculoskeletal: Negative for back pain, myalgias, neck pain and neck stiffness.  Skin: Negative for rash and wound.  Neurological: Negative for dizziness, weakness, light-headedness, numbness and headaches.  All other systems reviewed and are negative.     Allergies  Review of patient's allergies indicates no known allergies.  Home Medications   Prior to Admission medications   Medication Sig Start Date End Date Taking? Authorizing Provider  allopurinol (ZYLOPRIM) 100 MG tablet Take 1 tablet (100 mg total) by mouth daily. 12/25/13   Eugenie Filler, MD  budesonide-formoterol [place]) 160-4.5 MCG/ACT inhaler Inhale 2 puffs into the lungs 2 (two) times daily.    Historical Provider, MD  cetirizine (ZYRTEC) 10 MG tablet Take 10 mg by mouth daily.      Historical Provider, MD  Cholecalciferol (VITAMIN D-3 PO) Take 1 capsule by mouth daily.    Historical Provider, MD  dexamethasone (DECADRON) 4 MG tablet Take 1 tablet (4 mg total) by mouth daily. 12/25/13   Eugenie Filler, MD  diphenoxylate-atropine (LOMOTIL) 2.5-0.025 MG per tablet Take 1 tablet by mouth 4 (four) times daily as needed for diarrhea or loose stools.    Historical Provider, MD  docusate sodium (COLACE) 100 MG capsule Take 1 capsule (100 mg total) by mouth 2 (two) times daily. 12/25/13  Eugenie Filler, MD  dronabinol (MARINOL) 2.5 MG capsule Take 1 capsule 3 times a day 12/31/13   Volanda Napoleon, MD  feeding supplement, ENSURE COMPLETE, (ENSURE COMPLETE) LIQD Take 237 mLs by mouth 3 (three) times daily with meals. 12/25/13   Eugenie Filler, MD  fluconazole (DIFLUCAN) 100 MG tablet Take 1 tablet (100 mg total) by mouth daily. 12/31/13   Volanda Napoleon, MD  fluticasone (FLONASE) 50 MCG/ACT nasal spray Place 2 sprays into both nostrils daily as needed for allergies or rhinitis.    Historical Provider, MD  folic acid (FOLVITE) 1 MG tablet  Take 1 tablet (1 mg total) by mouth daily. 12/25/13   Eugenie Filler, MD  guaiFENesin (MUCINEX) 600 MG 12 hr tablet Take 1,200 mg by mouth 2 (two) times daily as needed.     Historical Provider, MD  methocarbamol (ROBAXIN) 750 MG tablet Take 1 tablet (750 mg total) by mouth every 8 (eight) hours as needed for muscle spasms. 11/24/13   Olam Idler, MD  montelukast (SINGULAIR) 10 MG tablet Take 10 mg by mouth at bedtime.    Historical Provider, MD  morphine (MSIR) 15 MG tablet Take 1 tablet (15 mg total) by mouth every 4 (four) hours as needed for severe pain. 12/25/13   Eugenie Filler, MD  nystatin (MYCOSTATIN) 100000 UNIT/ML suspension Take 10 mLs (1,000,000 Units total) by mouth 4 (four) times daily. Use for 5 days then stop. 12/25/13   Eugenie Filler, MD  ondansetron (ZOFRAN) 8 MG tablet Take 1 tablet (8 mg total) by mouth 2 (two) times daily. Start the day after chemo for 3 days. Then take as needed for nausea or vomiting. 12/23/13   Volanda Napoleon, MD  prochlorperazine (COMPAZINE) 10 MG tablet Take 1 tablet (10 mg total) by mouth every 6 (six) hours as needed (Nausea or vomiting). 12/23/13   Volanda Napoleon, MD  promethazine (PHENERGAN) 25 MG tablet Take 1 tablet (25 mg total) by mouth every 6 (six) hours as needed for nausea or refractory nausea / vomiting. 12/25/13   Eugenie Filler, MD  RABEprazole (ACIPHEX) 20 MG tablet Take 20 mg by mouth daily.    Historical Provider, MD  thiamine 100 MG tablet Take 1 tablet (100 mg total) by mouth daily. 12/25/13   Eugenie Filler, MD   BP 105/72  Pulse 95  Temp(Src) 97.8 F (36.6 C) (Oral)  Resp 18  SpO2 100% Physical Exam  Nursing note and vitals reviewed. Constitutional: He is oriented to person, place, and time. He appears well-developed and well-nourished. No distress.  HENT:  Head: Normocephalic and atraumatic.  Mouth/Throat: Oropharynx is clear and moist.  Eyes: EOM are normal. Pupils are equal, round, and reactive to light.  Neck:  Normal range of motion. Neck supple.  Cardiovascular: Normal rate and regular rhythm.   Pulmonary/Chest: Effort normal and breath sounds normal. No respiratory distress. He has no wheezes. He has no rales.  Abdominal: Soft. Bowel sounds are normal. He exhibits distension. He exhibits no mass. There is no tenderness. There is no rebound and no guarding.  Genitourinary:  Brown stool mass in the rectal vault.  Musculoskeletal: Normal range of motion. He exhibits no edema and no tenderness.  Neurological: He is alert and oriented to person, place, and time.  Skin: Skin is warm and dry. No rash noted. No erythema.  Psychiatric: He has a normal mood and affect. His behavior is normal.    ED Course  Procedures (including critical care time) Labs Review Labs Reviewed  CBC WITH DIFFERENTIAL - Abnormal; Notable for the following:    WBC 12.2 (*)    RBC 2.73 (*)    Hemoglobin 8.8 (*)    HCT 25.3 (*)    Platelets 100 (*)    All other components within normal limits  COMPREHENSIVE METABOLIC PANEL    Imaging Review Dg Abd 2 Views  01/01/2014   CLINICAL DATA:  68 year old male with rectal pain. Abdominal pain. Initial encounter. Lung Cancer undergoing treatment.  EXAM: ABDOMEN - 2 VIEW  COMPARISON:  Chest CT 12/15/2013.  FINDINGS: Upright and supine views of the abdomen. Hepatomegaly. No pneumoperitoneum. Stool ball in the rectum, no dilated bowel loops. Sequelae of lower abdominal hernia repair. No destructive osseous lesion identified. Degenerative changes in the lumbar spine and perhaps mild L1 compression fracture.  IMPRESSION: 1. Non obstructed bowel gas pattern. There is a stool ball in the rectum. 2. Hepatomegaly, probably reflecting widespread liver metastatic disease based on the chest CTA 12/15/2013.   Electronically Signed   By: Lars Pinks M.D.   On: 01/01/2014 00:16     EKG Interpretation None      MDM   Final diagnoses:  None   patient manually disimpacted. Patient then given a  Fleet enema in the emergency department with substantial bowel movement. States he is feeling better. We'll discharge home follow with primary doctor. Also start on MiraLAX and Colace. Abdomen remained soft and nontender. Return precautions given.     Julianne Rice, MD 01/01/14 [phone]

## 2014-01-05 ENCOUNTER — Ambulatory Visit: Payer: BC Managed Care – PPO | Admitting: Pulmonary Disease

## 2014-01-07 ENCOUNTER — Other Ambulatory Visit: Payer: Self-pay

## 2014-01-07 MED ORDER — MORPHINE SULFATE 15 MG PO TABS: 15.0000 mg | ORAL_TABLET | ORAL | Status: AC | PRN

## 2014-01-10 ENCOUNTER — Other Ambulatory Visit: Payer: Self-pay | Admitting: *Deleted

## 2014-01-10 ENCOUNTER — Telehealth: Payer: Self-pay | Admitting: Hematology & Oncology

## 2014-01-10 ENCOUNTER — Ambulatory Visit (HOSPITAL_BASED_OUTPATIENT_CLINIC_OR_DEPARTMENT_OTHER): Payer: BC Managed Care – PPO

## 2014-01-10 ENCOUNTER — Other Ambulatory Visit (HOSPITAL_BASED_OUTPATIENT_CLINIC_OR_DEPARTMENT_OTHER): Payer: BC Managed Care – PPO

## 2014-01-10 ENCOUNTER — Ambulatory Visit (HOSPITAL_COMMUNITY)
Admission: RE | Admit: 2014-01-10 | Discharge: 2014-01-10 | Disposition: A | Discharge status: Home / Self Care | Payer: BC Managed Care – PPO | Source: Ambulatory Visit | Attending: Hematology & Oncology | Admitting: Hematology & Oncology

## 2014-01-10 DIAGNOSIS — R945 Abnormal results of liver function studies: Secondary | ICD-10-CM

## 2014-01-10 DIAGNOSIS — E860 Dehydration: Secondary | ICD-10-CM | POA: Insufficient documentation

## 2014-01-10 DIAGNOSIS — C3490 Malignant neoplasm of unspecified part of unspecified bronchus or lung: Secondary | ICD-10-CM

## 2014-01-10 DIAGNOSIS — R7989 Other specified abnormal findings of blood chemistry: Secondary | ICD-10-CM

## 2014-01-10 DIAGNOSIS — R64 Cachexia: Secondary | ICD-10-CM

## 2014-01-10 DIAGNOSIS — Z5189 Encounter for other specified aftercare: Secondary | ICD-10-CM

## 2014-01-10 DIAGNOSIS — C787 Secondary malignant neoplasm of liver and intrahepatic bile duct: Secondary | ICD-10-CM

## 2014-01-10 DIAGNOSIS — C3491 Malignant neoplasm of unspecified part of right bronchus or lung: Secondary | ICD-10-CM

## 2014-01-10 LAB — ABO/RH: ABO/RH(D): O NEG

## 2014-01-10 LAB — COMPREHENSIVE METABOLIC PANEL (CC13)
ALT: 72 U/L — AB (ref 0–55)
ANION GAP: 12 meq/L — AB (ref 3–11)
AST: 77 U/L — ABNORMAL HIGH (ref 5–34)
Albumin: 2.9 g/dL — ABNORMAL LOW (ref 3.5–5.0)
Alkaline Phosphatase: 623 U/L — ABNORMAL HIGH (ref 40–150)
BUN: 12.3 mg/dL (ref 7.0–26.0)
CALCIUM: 9.1 mg/dL (ref 8.4–10.4)
CHLORIDE: 102 meq/L (ref 98–109)
CO2: 18 meq/L — AB (ref 22–29)
CREATININE: 0.7 mg/dL (ref 0.7–1.3)
Glucose: 119 mg/dl (ref 70–140)
Potassium: 4.1 mEq/L (ref 3.5–5.1)
Sodium: 132 mEq/L — ABNORMAL LOW (ref 136–145)
Total Bilirubin: 1.37 mg/dL — ABNORMAL HIGH (ref 0.20–1.20)
Total Protein: 6.1 g/dL — ABNORMAL LOW (ref 6.4–8.3)

## 2014-01-10 LAB — CBC WITH DIFFERENTIAL/PLATELET
BASO%: 1.7 % (ref 0.0–2.0)
Basophils Absolute: 0.3 10*3/uL — ABNORMAL HIGH (ref 0.0–0.1)
EOS ABS: 0.1 10*3/uL (ref 0.0–0.5)
EOS%: 0.3 % (ref 0.0–7.0)
HCT: 24.7 % — ABNORMAL LOW (ref 38.4–49.9)
HGB: 8.4 g/dL — ABNORMAL LOW (ref 13.0–17.1)
LYMPH%: 15.3 % (ref 14.0–49.0)
MCH: 31.8 pg (ref 27.2–33.4)
MCHC: 34 g/dL (ref 32.0–36.0)
MCV: 93.6 fL (ref 79.3–98.0)
MONO#: 1.3 10*3/uL — ABNORMAL HIGH (ref 0.1–0.9)
MONO%: 7.4 % (ref 0.0–14.0)
NEUT%: 75.3 % — ABNORMAL HIGH (ref 39.0–75.0)
NEUTROS ABS: 13.6 10*3/uL — AB (ref 1.5–6.5)
PLATELETS: 57 10*3/uL — AB (ref 140–400)
RBC: 2.64 10*6/uL — ABNORMAL LOW (ref 4.20–5.82)
RDW: 15.6 % — AB (ref 11.0–14.6)
WBC: 18.1 10*3/uL — ABNORMAL HIGH (ref 4.0–10.3)
lymph#: 2.8 10*3/uL (ref 0.9–3.3)
nRBC: 8 % — ABNORMAL HIGH (ref 0–0)

## 2014-01-10 LAB — PREPARE RBC (CROSSMATCH)

## 2014-01-10 LAB — LACTATE DEHYDROGENASE (CC13): LDH: 1475 U/L — ABNORMAL HIGH (ref 125–245)

## 2014-01-10 MED ORDER — SODIUM CHLORIDE 0.9 % IV SOLN
Freq: Once | INTRAVENOUS | Status: DC
  Administered 2014-01-10: 14:00:00 via INTRAVENOUS

## 2014-01-10 MED ORDER — SODIUM CHLORIDE 0.9 % IJ SOLN
10.0000 mL | INTRAMUSCULAR | Status: DC | PRN
  Administered 2014-01-10: 10 mL via INTRAVENOUS
  Filled 2014-01-10: qty 10

## 2014-01-10 MED ORDER — PROCHLORPERAZINE MALEATE 10 MG PO TABS
10.0000 mg | ORAL_TABLET | Freq: Once | ORAL | Status: AC
  Administered 2014-01-10: 10 mg via ORAL

## 2014-01-10 MED ORDER — HEPARIN SOD (PORK) LOCK FLUSH 100 UNIT/ML IV SOLN
500.0000 [IU] | Freq: Once | INTRAVENOUS | Status: AC
  Administered 2014-01-10: 500 [IU] via INTRAVENOUS
  Filled 2014-01-10: qty 5

## 2014-01-10 NOTE — Progress Notes (Signed)
Patient wife called stating that her husband has been very weak, and cant walk 10 feet without getting SOB.  Offered patient to come in for IVF but wife says he is eating and drinking great.  Just interested in labwork.  Wants to go to [facility] since closer.  [place] with Dr. Marin Olp. appt made

## 2014-01-10 NOTE — Progress Notes (Signed)
Patient Hgb 8.4 and symptomatic. Dr. Marin Olp wants patient to receive 2 units of PRBCS tomorrow at [facility]. Wants patient to receive IVF today.  Appt scheduled at [facility]

## 2014-01-10 NOTE — Patient Instructions (Signed)
Dehydration, Adult Dehydration is when you lose more fluids from the body than you take in. Vital organs like the kidneys, brain, and heart cannot function without a proper amount of fluids and salt. Any loss of fluids from the body can cause dehydration.  CAUSES   Vomiting.  Diarrhea.  Excessive sweating.  Excessive urine output.  Fever. SYMPTOMS  Mild dehydration  Thirst.  Dry lips.  Slightly dry mouth. Moderate dehydration  Very dry mouth.  Sunken eyes.  Skin does not bounce back quickly when lightly pinched and released.  Dark urine and decreased urine production.  Decreased tear production.  Headache. Severe dehydration  Very dry mouth.  Extreme thirst.  Rapid, weak pulse (more than 100 beats per minute at rest).  Cold hands and feet.  Not able to sweat in spite of heat and temperature.  Rapid breathing.  Blue lips.  Confusion and lethargy.  Difficulty being awakened.  Minimal urine production.  No tears. DIAGNOSIS  Your caregiver will diagnose dehydration based on your symptoms and your exam. Blood and urine tests will help confirm the diagnosis. The diagnostic evaluation should also identify the cause of dehydration. TREATMENT  Treatment of mild or moderate dehydration can often be done at home by increasing the amount of fluids that you drink. It is best to drink small amounts of fluid more often. Drinking too much at one time can make vomiting worse. Refer to the home care instructions below. Severe dehydration needs to be treated at the hospital where you will probably be given intravenous (IV) fluids that contain water and electrolytes. HOME CARE INSTRUCTIONS   Ask your caregiver about specific rehydration instructions.  Drink enough fluids to keep your urine clear or pale yellow.  Drink small amounts frequently if you have nausea and vomiting.  Eat as you normally do.  Avoid:  Foods or drinks high in sugar.  Carbonated  drinks.  Juice.  Extremely hot or cold fluids.  Drinks with caffeine.  Fatty, greasy foods.  Alcohol.  Tobacco.  Overeating.  Gelatin desserts.  Wash your hands well to avoid spreading bacteria and viruses.  Only take over-the-counter or prescription medicines for pain, discomfort, or fever as directed by your caregiver.  Ask your caregiver if you should continue all prescribed and over-the-counter medicines.  Keep all follow-up appointments with your caregiver. SEEK MEDICAL CARE IF:  You have abdominal pain and it increases or stays in one area (localizes).  You have a rash, stiff neck, or severe headache.  You are irritable, sleepy, or difficult to awaken.  You are weak, dizzy, or extremely thirsty. SEEK IMMEDIATE MEDICAL CARE IF:   You are unable to keep fluids down or you get worse despite treatment.  You have frequent episodes of vomiting or diarrhea.  You have blood or green matter (bile) in your vomit.  You have blood in your stool or your stool looks black and tarry.  You have not urinated in 6 to 8 hours, or you have only urinated a small amount of very dark urine.  You have a fever.  You faint. MAKE SURE YOU:   Understand these instructions.  Will watch your condition.  Will get help right away if you are not doing well or get worse. Document Released: 04/29/2005 Document Revised: 07/22/2011 Document Reviewed: 12/17/2010 ExitCare Patient Information 2015 [employer]. This information is not intended to replace advice given to you by your health care provider. Make sure you discuss any questions you have with your health care   provider.  

## 2014-01-10 NOTE — Telephone Encounter (Signed)
Pt and [facility] lab aware of 8-31 appointment

## 2014-01-11 ENCOUNTER — Ambulatory Visit (HOSPITAL_COMMUNITY)
Admission: RE | Admit: 2014-01-11 | Discharge: 2014-01-11 | Disposition: A | Discharge status: Home / Self Care | Payer: BC Managed Care – PPO | Source: Ambulatory Visit | Attending: Hematology & Oncology | Admitting: Hematology & Oncology

## 2014-01-11 ENCOUNTER — Ambulatory Visit (HOSPITAL_BASED_OUTPATIENT_CLINIC_OR_DEPARTMENT_OTHER): Payer: BC Managed Care – PPO

## 2014-01-11 ENCOUNTER — Other Ambulatory Visit: Payer: Self-pay | Admitting: *Deleted

## 2014-01-11 ENCOUNTER — Encounter: Payer: Self-pay | Admitting: *Deleted

## 2014-01-11 VITALS — BP 100/70 | HR 95 | Temp 98.0°F | Resp 16

## 2014-01-11 DIAGNOSIS — C3490 Malignant neoplasm of unspecified part of unspecified bronchus or lung: Secondary | ICD-10-CM

## 2014-01-11 MED ORDER — SODIUM CHLORIDE 0.9 % IV SOLN
250.0000 mL | Freq: Once | INTRAVENOUS | Status: AC
  Administered 2014-01-11: 250 mL via INTRAVENOUS

## 2014-01-11 MED ORDER — FUROSEMIDE 10 MG/ML IJ SOLN
20.0000 mg | Freq: Once | INTRAMUSCULAR | Status: AC
  Administered 2014-01-11: 20 mg via INTRAVENOUS

## 2014-01-11 MED ORDER — ACETAMINOPHEN 325 MG PO TABS
ORAL_TABLET | ORAL | Status: AC
  Filled 2014-01-11: qty 2

## 2014-01-11 MED ORDER — DIPHENHYDRAMINE HCL 25 MG PO CAPS
25.0000 mg | ORAL_CAPSULE | Freq: Once | ORAL | Status: AC
  Administered 2014-01-11: 25 mg via ORAL

## 2014-01-11 MED ORDER — ACETAMINOPHEN 325 MG PO TABS
650.0000 mg | ORAL_TABLET | Freq: Once | ORAL | Status: AC
  Administered 2014-01-11: 650 mg via ORAL

## 2014-01-11 MED ORDER — DIPHENHYDRAMINE HCL 25 MG PO CAPS
ORAL_CAPSULE | ORAL | Status: AC
  Filled 2014-01-11: qty 1

## 2014-01-11 NOTE — Progress Notes (Unsigned)
Received call from Evat with [facility].  She states pt's wife called for a consult with Hospice.  Evat is concerned because she does not think Mrs [name] [name] understands that the chemotherapy will have to stop when he is admitted to Hospice.  Called pt's wife and explained this to her.  She has ha friend that works with the [place] and though her husband could be in this program and still get the chemo.  States she does not want to give up the chemo.  States she needs a hospital bed and a ramp into the house and thought [facility] could help with this.  Ask her to call [place] and see if her insurance would cover the bed and ramp.  Told her if they need an order to call our office.  Mrs. [name] [name] expressed understanding.  Dr. Marin Olp made aware of this call.

## 2014-01-11 NOTE — Patient Instructions (Signed)

## 2014-01-12 LAB — TYPE AND SCREEN
ABO/RH(D): O NEG
ANTIBODY SCREEN: NEGATIVE
Unit division: 0
Unit division: 0

## 2014-01-15 LAB — HEMOCHROMATOSIS DNA-PCR(C282Y,H63D)

## 2014-01-15 LAB — PREALBUMIN: PREALBUMIN: 22.3 mg/dL (ref 17.0–34.0)

## 2014-01-17 ENCOUNTER — Other Ambulatory Visit: Payer: Self-pay | Admitting: Hematology

## 2014-01-17 ENCOUNTER — Telehealth: Payer: Self-pay | Admitting: Hematology

## 2014-01-17 NOTE — Telephone Encounter (Signed)
ON CALL ONCOLOGIST: 01/17/2014 11:15 am  I spoke with Lanae Boast patients wife today and she is asking for hospice to see patient today. 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 He is bed bound very deconditioned, not ambulatory and not eating. They do not want more chemotherapy. I think it is reasonable. I told them [name] has an appointment to see them tomorrow and they can discuss it with him but patient and his wife are very adamant and want to proceed with hospice and palliative care measures so I called and informed Charlett Nose in [facility] care and they will see patient today. I will notify [name] about this development.  Bernadene Bell, MD Pager [phone]

## 2014-01-18 ENCOUNTER — Other Ambulatory Visit: Payer: BC Managed Care – PPO | Admitting: Lab

## 2014-01-18 ENCOUNTER — Other Ambulatory Visit: Payer: Self-pay | Admitting: *Deleted

## 2014-01-18 ENCOUNTER — Ambulatory Visit: Payer: BC Managed Care – PPO | Admitting: Hematology & Oncology

## 2014-01-18 MED ORDER — ATROPINE SULFATE 1 % OP SOLN: 3.0000 [drp] | OPHTHALMIC | Status: AC | PRN

## 2014-01-18 MED ORDER — MORPHINE SULFATE (CONCENTRATE) 20 MG/ML PO SOLN: 10.0000 mg | ORAL | Status: AC | PRN

## 2014-01-19 ENCOUNTER — Encounter: Payer: Self-pay | Admitting: *Deleted

## 2014-01-24 ENCOUNTER — Telehealth: Payer: Self-pay

## 2014-01-24 NOTE — Telephone Encounter (Signed)
Patient died @ Home per [name] [name]

## 2014-01-26 ENCOUNTER — Ambulatory Visit: Payer: BC Managed Care – PPO

## 2014-01-27 ENCOUNTER — Ambulatory Visit: Payer: BC Managed Care – PPO

## 2014-01-28 ENCOUNTER — Ambulatory Visit: Payer: BC Managed Care – PPO

## 2014-01-31 ENCOUNTER — Ambulatory Visit: Payer: BC Managed Care – PPO | Admitting: Internal Medicine

## 2014-02-10 NOTE — Progress Notes (Signed)
Received phone call from Hospice nurse stating that the patient passed away on February 07, 2014 at 6:39am. [name] was informed of the death.

## 2014-02-10 DEATH — deceased

## 2014-05-18 ENCOUNTER — Ambulatory Visit: Payer: BC Managed Care – PPO | Admitting: Pulmonary Disease

## 2014-08-04 ENCOUNTER — Other Ambulatory Visit: Payer: Self-pay | Admitting: Family

## 2014-12-15 ENCOUNTER — Encounter: Payer: Self-pay | Admitting: Gastroenterology

## 2016-05-11 IMAGING — MR MR HEAD WO/W CM
8 of 13 series · 33 of 48 positions shown · IV contrast (Yes   MULTI)
Comparison: CT head without contrast 12/17/2008

CLINICAL DATA: Left lower lobe lung mass.  Confusion and weakness.

EXAM:
MRI HEAD WITHOUT AND WITH CONTRAST
TECHNIQUE: Multiplanar, multiecho pulse sequences of the brain and surrounding
structures were obtained without and with intravenous contrast.
CONTRAST:  15mL MULTIHANCE GADOBENATE DIMEGLUMINE 529 MG/ML IV SOLN

[Series 7: FLAIR · axial · 5.0mm · 0.45mm/px · z∈[+9,+153]mm · 2 of 25 slices shown]
[im 1/25]
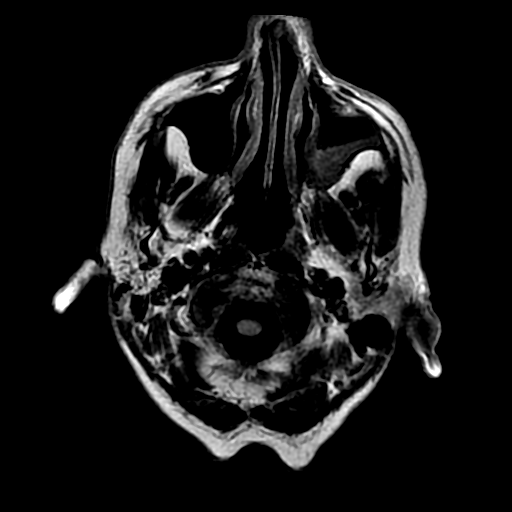
[im 25/25]
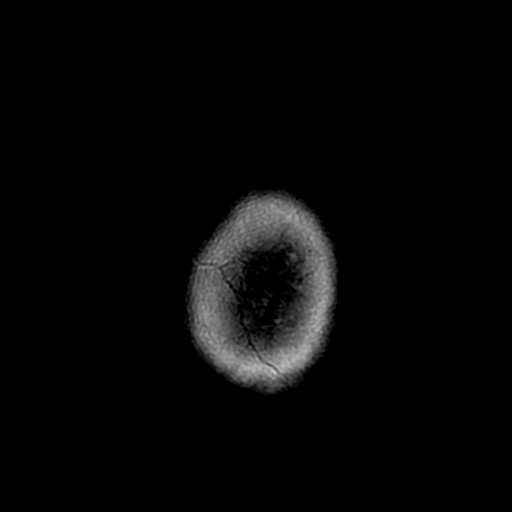

[Series 8: DWI · coronal · 5.0mm · 1.09mm/px · 7 of 66 slices shown (1 of 4)]
[im 1/66]
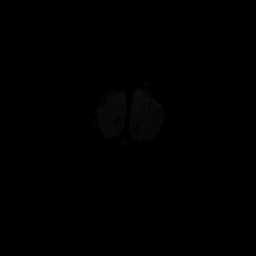
[im 11/66]
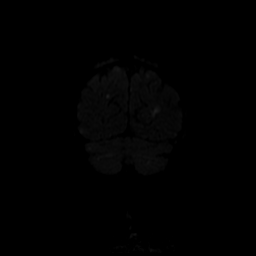
[im 22/66]
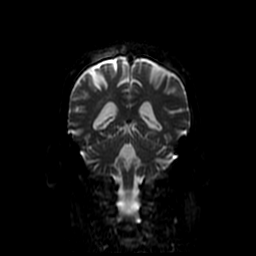
[im 33/66]
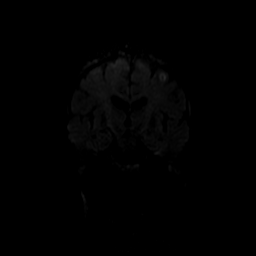
[im 44/66]
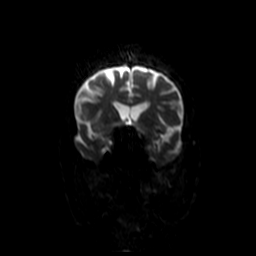
[im 55/66]
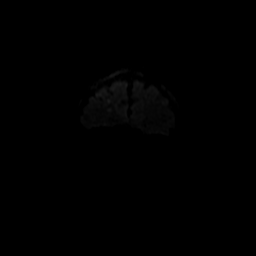
[im 66/66]
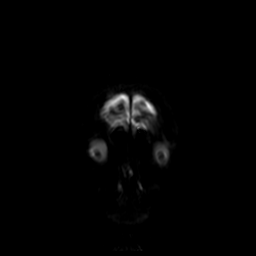

[Series 9: T2 · axial · 5.0mm · 0.45mm/px · z∈[+9,+153]mm · 3 of 25 slices shown]
[im 1/25]
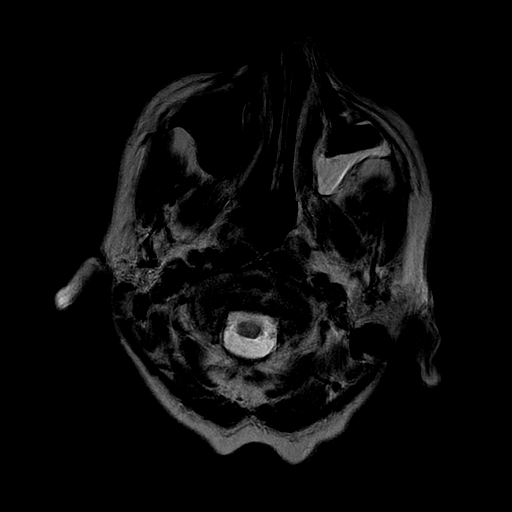
[im 13/25]
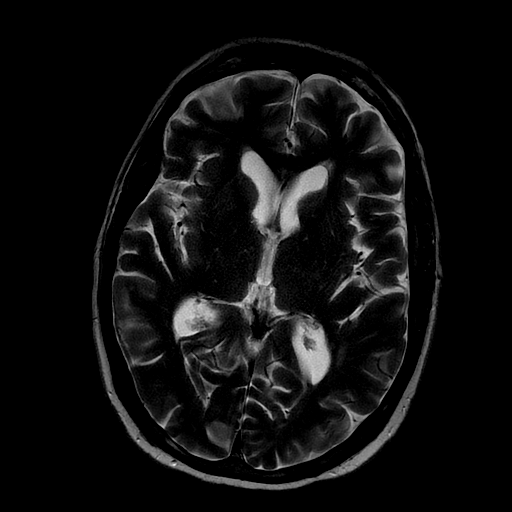
[im 25/25]
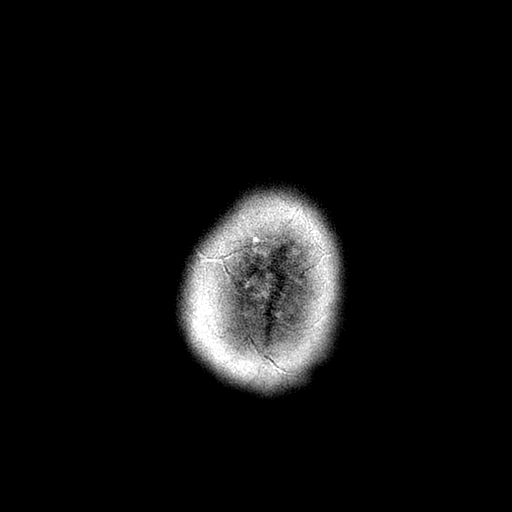

[Series 10: DWI · axial · 5.0mm · 1.09mm/px · z∈[+10,+155]mm · 7 of 60 slices shown (2 of 4)]
[im 1/60]
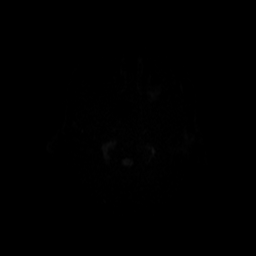
[im 10/60]
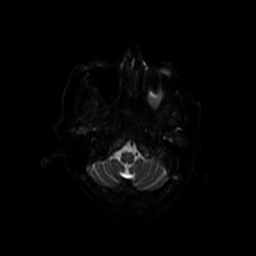
[im 20/60]
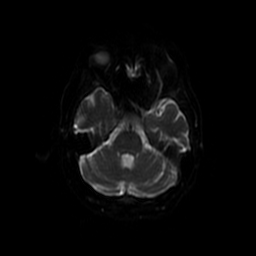
[im 30/60]
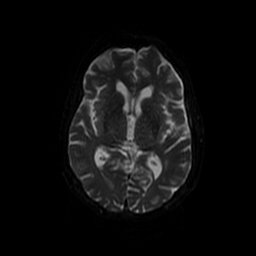
[im 40/60]
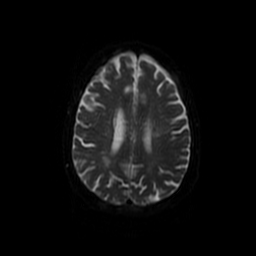
[im 50/60]
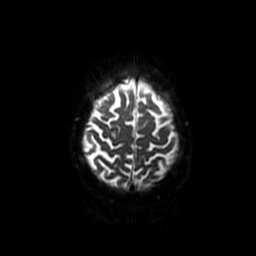
[im 60/60]
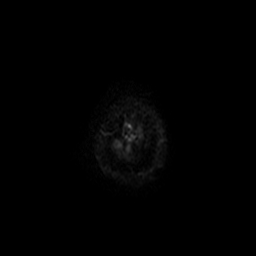

[Series 15: T1 post-contrast · coronal · 5.0mm · 0.43mm/px · 4 of 31 slices shown (1 of 2)]
[im 1/31]
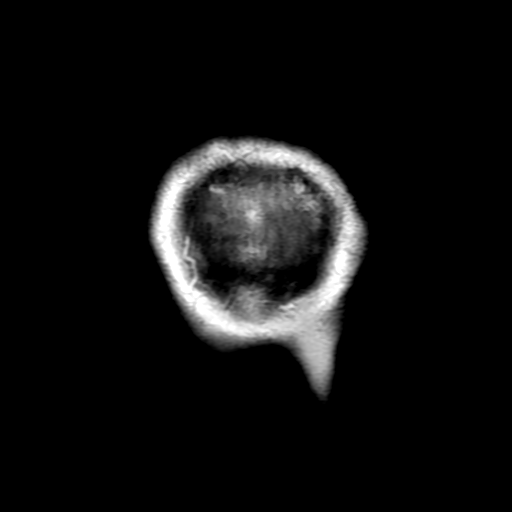
[im 11/31]
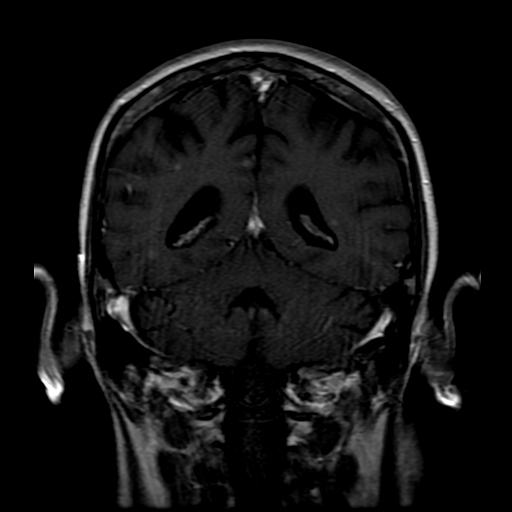
[im 21/31]
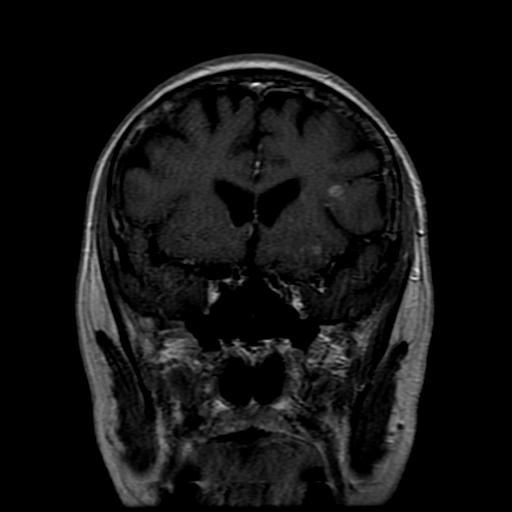
[im 31/31]
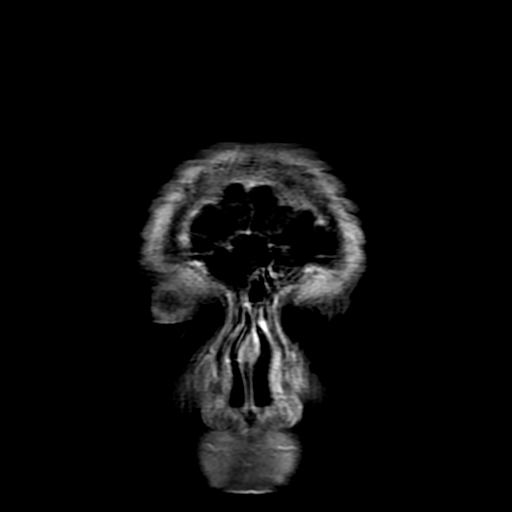

[Series 16: T1 post-contrast · sagittal · 5.0mm · 0.47mm/px · 3 of 24 slices shown (2 of 2)]
[im 1/24]
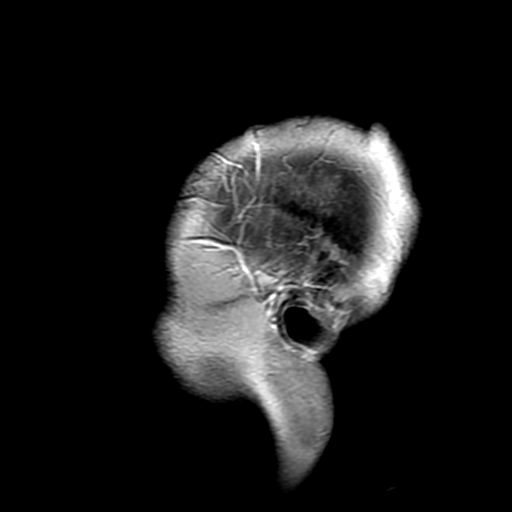
[im 12/24]
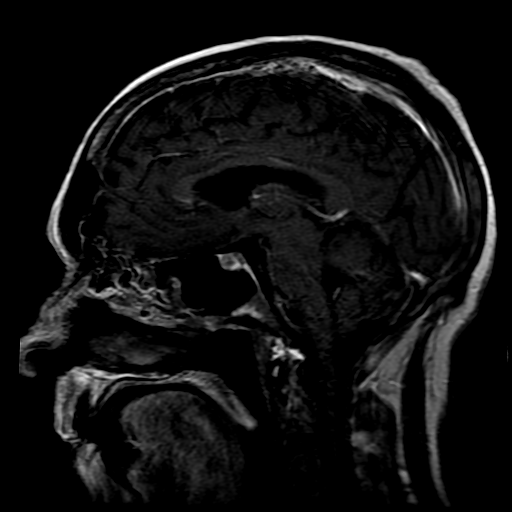
[im 24/24]
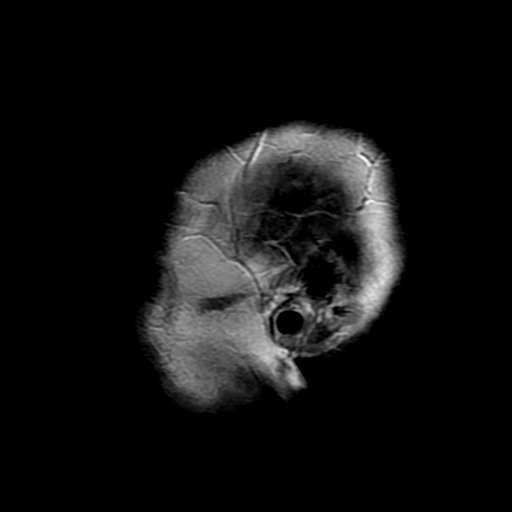

[Series 800: DWI · coronal · 5.0mm · 1.09mm/px · 4 of 33 slices shown (3 of 4)]
[im 1/33]
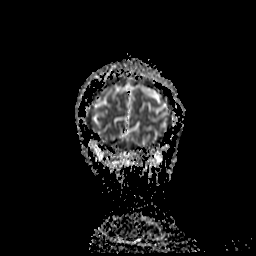
[im 11/33]
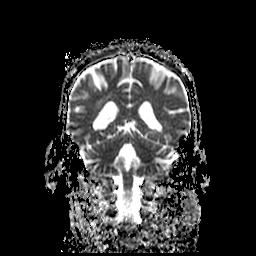
[im 22/33]
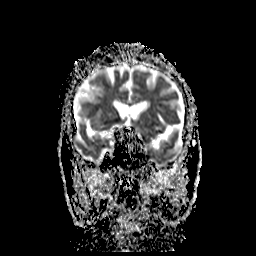
[im 33/33]
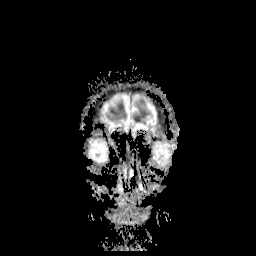

[Series 1000: DWI · axial · 5.0mm · 1.09mm/px · z∈[+10,+155]mm · 3 of 30 slices shown (4 of 4)]
[im 1/30]
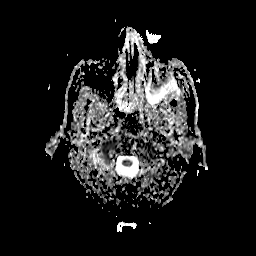
[im 15/30]
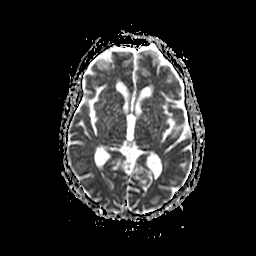
[im 30/30]
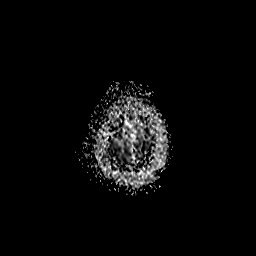

[33 of 48 positions shown; findings below may reference images not displayed]

FINDINGS: Numerous bilateral cerebral and cerebellar ring-enhancing lesions
are present bilaterally. These demonstrate restricted diffusion.
There is relatively minimal surrounding vasogenic edema associated
with the lesions. T2 signal is noted along the rim of the lesions.
There are several lesions posterior to the atrium of the left
lateral ventricle in the left parietal and occipital lobe white
matter. A left parietal lesion measures 7 mm. No individual lesions
are greater than 10 mm.

Diffusion signal abnormality is associated with the dura as well.
There is some heterogeneous enhancement of the dura suggesting
metastatic disease.

Minimal periventricular white matter changes are noted otherwise.

Flow is present in the major intracranial arteries. The globes and
orbits are intact. A fluid level is present in the left maxillary
sinus.

There is no hemorrhage associated with any of the lesions.
IMPRESSION: 1. Numerous bilateral cerebral and cerebellar sub cm peripherally
enhancing lesions bilaterally compatible with metastatic disease.
2. Restricted diffusion is associated with nearly all of the
lesions. This suggests a high-grade tumor and probably a small cell
lung cancer given the chest findings. Restricted diffusion could
also be seen with infection, but a much greater degree of vasogenic
edema would be expected and patient would likely present with
profound mental status changes given the extent of disease.
3. Probable metastatic disease of the dura.
These results were called by telephone at the time of interpretation
on 12/20/2013 at [DATE] to Dr. Mariko, who verbally acknowledged
these results.

## 2016-05-12 IMAGING — US IR US GUIDE VASC ACCESS RIGHT
1 series · 2 of 2 positions shown · non-contrast
Comparison: none

CLINICAL DATA: METASTATIC LUNG CANCER

[Series 1: ir fluoro guide cv line*right* · 2 of 2 slices shown]
[im 1/2]
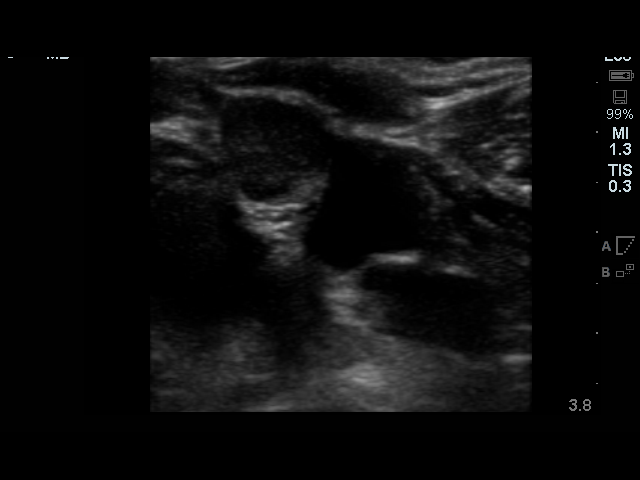
[im 2/2]
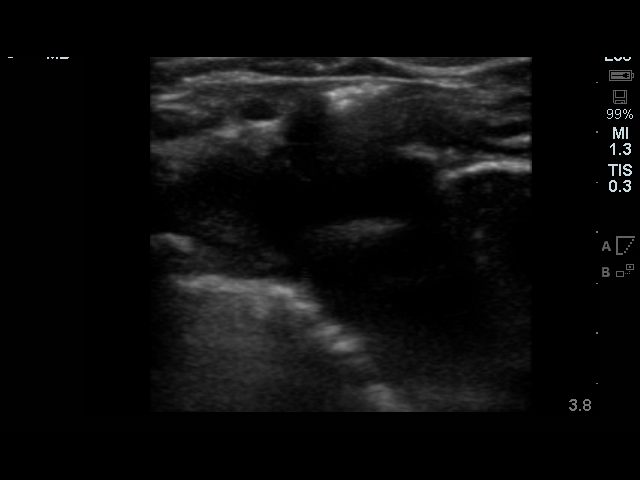

[2 of 2 positions shown; findings below may reference images not displayed]

EXAM:
RIGHT IJ SINGLE LUMEN POWER PORT CATHETER INSERTION

Date:  [DATE] [DATE]

Radiologist:  Jim, Lorraine

Guidance:  Ultrasound fluoroscopic

FLUOROSCOPY TIME:  36 seconds

MEDICATIONS AND MEDICAL HISTORY:
2 g Ancefadministered within 1 hour of the procedure.1.5 mg Versed,
50 mcg fentanyl

ANESTHESIA/SEDATION:
30 min

CONTRAST:  None.

COMPLICATIONS:
None immediate

PROCEDURE:
Informed consent was obtained from the patient following explanation
of the procedure, risks, benefits and alternatives. The patient
understands, agrees and consents for the procedure. All questions
were addressed. A time out was performed.

Maximal barrier sterile technique utilized including caps, mask,
sterile gowns, sterile gloves, large sterile drape, hand hygiene,
and 2% chlorhexidine scrub.

Under sterile conditions and local anesthesia, right internal
micropuncture venous access was performed. Access was performed with
ultrasound. Images were obtained for documentation. A guide wire was
inserted followed by a transitional dilator. This allowed insertion
of a guide wire and catheter into the IVC. Measurements were
obtained from the SVC / RA junction back to the right IJ venotomy
site. In the right infraclavicular chest, a subcutaneous pocket was
created over the second anterior rib. This was done under sterile
conditions and local anesthesia. 1% lidocaine with epinephrine was
utilized for this. A 2.5 cm incision was made in the skin. Blunt
dissection was performed to create a subcutaneous pocket over the
right pectoralis major muscle. The pocket was flushed with saline
vigorously. There was adequate hemostasis. The port catheter was
assembled and checked for leakage. The port catheter was secured in
the pocket with two retention sutures. The tubing was tunneled
subcutaneously to the Right venotomy site and inserted into the
SVC/RA junction through a valved peel-away sheath. Position was
confirmed with fluoroscopy. Images were obtained for documentation.
The patient tolerated the procedure well. No immediate
complications. Incisions were closed in a two layer fashion with 4 -
0 Vicryl suture. Dermabond was applied to the skin. The port
catheter was accessed, blood was aspirated followed by saline and
heparin flushes. Needle was removed. A dry sterile dressing was
applied.
IMPRESSION: Ultrasound and fluoroscopically guided right internal jugular single
lumen power port catheter insertion. Tip in the SVC/RA junction.
Catheter ready for use.
